# Patient Record
Sex: Female | Born: 1995 | Race: Black or African American | Hispanic: No | Marital: Single | Smoking: Never smoker
Health system: Southern US, Community
[De-identification: ages and names within clinical notes are randomized; demographics above are authoritative.]

## PROBLEM LIST (undated history)

## (undated) DIAGNOSIS — D649 Anemia, unspecified: Secondary | ICD-10-CM

## (undated) DIAGNOSIS — R519 Headache, unspecified: Secondary | ICD-10-CM

## (undated) DIAGNOSIS — F419 Anxiety disorder, unspecified: Secondary | ICD-10-CM

## (undated) DIAGNOSIS — R001 Bradycardia, unspecified: Secondary | ICD-10-CM

## (undated) DIAGNOSIS — J45909 Unspecified asthma, uncomplicated: Secondary | ICD-10-CM

## (undated) HISTORY — DX: Anxiety disorder, unspecified: F41.9

## (undated) HISTORY — DX: Anemia, unspecified: D64.9

## (undated) HISTORY — DX: Headache, unspecified: R51.9

## (undated) HISTORY — DX: Unspecified asthma, uncomplicated: J45.909

---
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
## 2019-10-12 ENCOUNTER — Emergency Department (HOSPITAL_COMMUNITY): Payer: Medicaid Other

## 2019-10-12 ENCOUNTER — Emergency Department (HOSPITAL_COMMUNITY)
Admission: EM | Admit: 2019-10-12 | Discharge: 2019-10-13 | Discharge status: Against Medical Advice | Payer: Medicaid Other | Attending: Emergency Medicine | Admitting: Emergency Medicine

## 2019-10-12 ENCOUNTER — Encounter (HOSPITAL_COMMUNITY): Payer: Self-pay

## 2019-10-12 DIAGNOSIS — Z5321 Procedure and treatment not carried out due to patient leaving prior to being seen by health care provider: Secondary | ICD-10-CM | POA: Diagnosis not present

## 2019-10-12 DIAGNOSIS — R0602 Shortness of breath: Secondary | ICD-10-CM | POA: Diagnosis present

## 2019-10-12 LAB — CBC WITH DIFFERENTIAL/PLATELET
Abs Immature Granulocytes: 0.01 10*3/uL (ref 0.00–0.07)
Basophils Absolute: 0.1 10*3/uL (ref 0.0–0.1)
Basophils Relative: 1 %
Eosinophils Absolute: 0.1 10*3/uL (ref 0.0–0.5)
Eosinophils Relative: 3 %
HCT: 37.7 % (ref 36.0–46.0)
Hemoglobin: 11.8 g/dL — ABNORMAL LOW (ref 12.0–15.0)
Immature Granulocytes: 0 %
Lymphocytes Relative: 33 %
Lymphs Abs: 1.4 10*3/uL (ref 0.7–4.0)
MCH: 25.6 pg — ABNORMAL LOW (ref 26.0–34.0)
MCHC: 31.3 g/dL (ref 30.0–36.0)
MCV: 81.8 fL (ref 80.0–100.0)
Monocytes Absolute: 0.4 10*3/uL (ref 0.1–1.0)
Monocytes Relative: 9 %
Neutro Abs: 2.3 10*3/uL (ref 1.7–7.7)
Neutrophils Relative %: 54 %
Platelets: 323 10*3/uL (ref 150–400)
RBC: 4.61 MIL/uL (ref 3.87–5.11)
RDW: 15.7 % — ABNORMAL HIGH (ref 11.5–15.5)
WBC: 4.3 10*3/uL (ref 4.0–10.5)
nRBC: 0 % (ref 0.0–0.2)

## 2019-10-12 LAB — POC SARS CORONAVIRUS 2 AG -  ED: SARS Coronavirus 2 Ag: NEGATIVE

## 2019-10-12 LAB — BASIC METABOLIC PANEL
Anion gap: 11 (ref 5–15)
BUN: 10 mg/dL (ref 6–20)
CO2: 21 mmol/L — ABNORMAL LOW (ref 22–32)
Calcium: 8.8 mg/dL — ABNORMAL LOW (ref 8.9–10.3)
Chloride: 107 mmol/L (ref 98–111)
Creatinine, Ser: 0.77 mg/dL (ref 0.44–1.00)
GFR calc Af Amer: >60 mL/min (ref >60)
GFR calc non Af Amer: >60 mL/min (ref >60)
Glucose, Bld: 74 mg/dL (ref 70–99)
Potassium: 4.1 mmol/L (ref 3.5–5.1)
Sodium: 139 mmol/L (ref 135–145)

## 2019-10-12 LAB — TROPONIN I (HIGH SENSITIVITY): Troponin I (High Sensitivity): <2 ng/L (ref <18)

## 2019-10-12 NOTE — ED Triage Notes (Signed)
Pt reports that since that morning she has been having SOB and fatigue. Reports being exposed to COVID at work

## 2019-10-13 LAB — TROPONIN I (HIGH SENSITIVITY): Troponin I (High Sensitivity): <2 ng/L (ref <18)

## 2019-10-13 NOTE — ED Notes (Signed)
Called for VS recheck x3, no response. 

## 2020-01-23 ENCOUNTER — Emergency Department (HOSPITAL_COMMUNITY): Payer: Medicaid Other

## 2020-01-23 ENCOUNTER — Encounter (HOSPITAL_COMMUNITY): Payer: Self-pay | Admitting: Emergency Medicine

## 2020-01-23 ENCOUNTER — Other Ambulatory Visit: Payer: Self-pay

## 2020-01-23 ENCOUNTER — Emergency Department (HOSPITAL_COMMUNITY)
Admission: EM | Admit: 2020-01-23 | Discharge: 2020-01-23 | Disposition: A | Discharge status: Home / Self Care | Payer: Medicaid Other | Attending: Emergency Medicine | Admitting: Emergency Medicine

## 2020-01-23 DIAGNOSIS — M542 Cervicalgia: Secondary | ICD-10-CM | POA: Diagnosis not present

## 2020-01-23 DIAGNOSIS — R079 Chest pain, unspecified: Secondary | ICD-10-CM | POA: Diagnosis present

## 2020-01-23 DIAGNOSIS — M436 Torticollis: Secondary | ICD-10-CM | POA: Insufficient documentation

## 2020-01-23 LAB — BASIC METABOLIC PANEL
Anion gap: 10 (ref 5–15)
BUN: 14 mg/dL (ref 6–20)
CO2: 22 mmol/L (ref 22–32)
Calcium: 8.7 mg/dL — ABNORMAL LOW (ref 8.9–10.3)
Chloride: 110 mmol/L (ref 98–111)
Creatinine, Ser: 0.63 mg/dL (ref 0.44–1.00)
GFR calc Af Amer: >60 mL/min (ref >60)
GFR calc non Af Amer: >60 mL/min (ref >60)
Glucose, Bld: 78 mg/dL (ref 70–99)
Potassium: 4.8 mmol/L (ref 3.5–5.1)
Sodium: 142 mmol/L (ref 135–145)

## 2020-01-23 LAB — CBC
HCT: 41.9 % (ref 36.0–46.0)
Hemoglobin: 13 g/dL (ref 12.0–15.0)
MCH: 26.2 pg (ref 26.0–34.0)
MCHC: 31 g/dL (ref 30.0–36.0)
MCV: 84.3 fL (ref 80.0–100.0)
Platelets: 329 10*3/uL (ref 150–400)
RBC: 4.97 MIL/uL (ref 3.87–5.11)
RDW: 15.7 % — ABNORMAL HIGH (ref 11.5–15.5)
WBC: 4.5 10*3/uL (ref 4.0–10.5)
nRBC: 0 % (ref 0.0–0.2)

## 2020-01-23 LAB — I-STAT BETA HCG BLOOD, ED (MC, WL, AP ONLY): I-stat hCG, quantitative: <5 m[IU]/mL (ref <5)

## 2020-01-23 LAB — TROPONIN I (HIGH SENSITIVITY): Troponin I (High Sensitivity): <2 ng/L (ref <18)

## 2020-01-23 MED ORDER — CYCLOBENZAPRINE HCL 10 MG PO TABS: 10.0000 mg | ORAL_TABLET | Freq: Two times a day (BID) | ORAL | 0 refills | Status: AC | PRN

## 2020-01-23 MED ORDER — IBUPROFEN 600 MG PO TABS: 600.0000 mg | ORAL_TABLET | Freq: Four times a day (QID) | ORAL | 0 refills | Status: AC | PRN

## 2020-01-23 MED ORDER — IBUPROFEN 600 MG PO TABS: 600.0000 mg | ORAL_TABLET | Freq: Three times a day (TID) | ORAL | 0 refills | Status: DC | PRN

## 2020-01-23 MED ORDER — IBUPROFEN 200 MG PO TABS
600.0000 mg | ORAL_TABLET | Freq: Once | ORAL | Status: AC
  Administered 2020-01-23: 600 mg via ORAL
  Filled 2020-01-23: qty 3

## 2020-01-23 MED ORDER — PREDNISONE 20 MG PO TABS: 40.0000 mg | ORAL_TABLET | Freq: Every day | ORAL | 0 refills | Status: AC

## 2020-01-23 MED ORDER — PREDNISONE 20 MG PO TABS
40.0000 mg | ORAL_TABLET | Freq: Once | ORAL | Status: AC
  Administered 2020-01-23: 16:00:00 40 mg via ORAL
  Filled 2020-01-23: qty 2

## 2020-01-23 NOTE — ED Triage Notes (Signed)
Per patient, left chest pain since Thursday-has had a stent placed in the past

## 2020-01-23 NOTE — ED Provider Notes (Signed)
Lab tests and xrays reviewed.  No acute findings.  Pt states she is feeling much better after treatment.  No signs of pneumonia, PERC negative, no cardiac injury.  Plan on dc home with ibuprofen , muscle relaxant.   Linwood Dibbles, MD 01/23/20 [phone]

## 2020-01-23 NOTE — ED Provider Notes (Signed)
[place] DEPT Provider Note   CSN: [id number] Arrival date & time: 01/23/20  1412     History Chief Complaint  Patient presents with  . Chest Pain    Pamela Hart is a 24 y.o. female.  HPI   24 year old female with left lateral neck/shoulder/upper chest pain.  Onset Thursday when she woke up.  Worse in the next day.  Pain is persistent.  Hurts at rest and sniffily worse with neck movement and range of motion at the shoulder.  She has not tried taking anything for her symptoms.  No acute respiratory complaints.  No rash.  No fevers or chills.  Denies any radicular symptoms.  History reviewed. No pertinent past medical history.  There are no problems to display for this patient.   History reviewed. No pertinent surgical history.   OB History   No obstetric history on file.     No family history on file.  Social History   Tobacco Use  . Smoking status: Not on file  Substance Use Topics  . Alcohol use: Not on file  . Drug use: Not on file    Home Medications Prior to Admission medications   Medication Sig Start Date End Date Taking? Authorizing Provider  ibuprofen (ADVIL) 600 MG tablet Take 1 tablet (600 mg total) by mouth every 6 (six) hours as needed. 01/23/20   Virgel Manifold, MD  predniSONE (DELTASONE) 20 MG tablet Take 2 tablets (40 mg total) by mouth daily. 01/23/20   Virgel Manifold, MD    Allergies    Bee venom, Penicillins, and Peanut-containing drug products  Review of Systems   Review of Systems All systems reviewed and negative, other than as noted in HPI.  Physical Exam Updated Vital Signs BP 119/65 (BP Location: Left Arm)   Pulse 75   Temp 98 F (36.7 C) (Oral)   Resp 18   LMP 01/20/2020   SpO2 100%   Physical Exam Vitals and nursing note reviewed.  Constitutional:      General: She is not in acute distress.    Appearance: She is well-developed.  HENT:     Head: Normocephalic and atraumatic.    Eyes:     General:        Right eye: No discharge.        Left eye: No discharge.     Conjunctiva/sclera: Conjunctivae normal.  Cardiovascular:     Rate and Rhythm: Normal rate and regular rhythm.     Heart sounds: Normal heart sounds. No murmur. No friction rub. No gallop.   Pulmonary:     Effort: Pulmonary effort is normal. No respiratory distress.     Breath sounds: Normal breath sounds.  Abdominal:     General: There is no distension.     Palpations: Abdomen is soft.     Tenderness: There is no abdominal tenderness.  Musculoskeletal:        General: No tenderness.     Cervical back: Neck supple.     Comments: Tenderness to palpation left lateral neck and left upper trapezius.  Grossly normal in appearance.  Increased pain with abduction of the shoulder.  Neurovascularly intact distally.  No midline tenderness.  Skin:    General: Skin is warm and dry.  Neurological:     Mental Status: She is alert.  Psychiatric:        Behavior: Behavior normal.        Thought Content: Thought content normal.     ED  Results / Procedures / Treatments   Labs (all labs ordered are listed, but only abnormal results are displayed) Labs Reviewed  BASIC METABOLIC PANEL - Abnormal; Notable for the following components:      Result Value   Calcium 8.7 (*)    All other components within normal limits  CBC - Abnormal; Notable for the following components:   RDW 15.7 (*)    All other components within normal limits  I-STAT BETA HCG BLOOD, ED ([facility], [facility], [facility] ONLY)  TROPONIN I (HIGH SENSITIVITY)    EKG EKG Interpretation  Date/Time:  Sunday January 23 2020 14:22:53 EDT Ventricular Rate:  72 PR Interval:    QRS Duration: 77 QT Interval:  379 QTC Calculation: 415 R Axis:   53 Text Interpretation: Sinus rhythm ST elev, probable normal early repol pattern No significant change since last tracing Confirmed by Zacary Bauer ([id number]) on 01/23/2020 3:06:37 PM   Radiology DG Chest 2 View  Result  Date: 01/23/2020 CLINICAL DATA:  Chest pain. EXAM: CHEST - 2 VIEW COMPARISON:  October 12, 2019. FINDINGS: The heart size and mediastinal contours are within normal limits. Both lungs are clear. No pneumothorax or pleural effusion is noted. The visualized skeletal structures are unremarkable. IMPRESSION: No active cardiopulmonary disease. Electronically Signed   By: James  Green Jr M.D.   On: 01/23/2020 15:20    Procedures Procedures (including critical care time)  Medications Ordered in ED Medications  ibuprofen (ADVIL) tablet 600 mg (600 mg Oral Given 01/23/20 1531)  predniSONE (DELTASONE) tablet 40 mg (40 mg Oral Given 01/23/20 1531)    ED Course  I have reviewed the triage vital signs and the nursing notes.  Pertinent labs & imaging results that were available during my care of the patient were reviewed by me and considered in my medical decision making (see chart for details).    MDM Rules/Calculators/A&P                      23 year old female with left lateral neck and shoulder pain.  Very consistent with musculoskeletal etiology given exacerbating features and reproduction with palpation/movement.  Very typical for ACS.  Doubt other emergent processes PE, dissection. Etc. triage note reviewed.  Patient does not have a history of known coronary artery disease nor she had prior stenting.  She has been evaluated previously for chest discomfort and bradycardia.  She did have a Biopatch placed at some point but no significance intervention/testing otherwise.  Imaging without acute abnormality.  EKG is similar to prior.  Care signed out with labs pending.  Anticipate discharge assuming they are noncontributory.  Symptomatic treatment and outpatient follow-up.  Final Clinical Impression(s) / ED Diagnoses Final diagnoses:  Neck pain on left side  Torticollis, acute    Rx / DC Orders ED Discharge Orders         Ordered    predniSONE (DELTASONE) 20 MG tablet  Daily     01/23/20 1524     ibuprofen (ADVIL) 600 MG tablet  Every 6 hours PRN     04 /18/21 1524           11-08-1992, MD 01/26/20 1412

## 2020-03-20 ENCOUNTER — Encounter (HOSPITAL_COMMUNITY): Payer: Self-pay

## 2020-03-20 ENCOUNTER — Other Ambulatory Visit: Payer: Self-pay

## 2020-03-20 ENCOUNTER — Emergency Department (HOSPITAL_COMMUNITY)
Admission: EM | Admit: 2020-03-20 | Discharge: 2020-03-20 | Disposition: A | Discharge status: Home / Self Care | Payer: Medicaid Other | Attending: Emergency Medicine | Admitting: Emergency Medicine

## 2020-03-20 DIAGNOSIS — Z5321 Procedure and treatment not carried out due to patient leaving prior to being seen by health care provider: Secondary | ICD-10-CM | POA: Diagnosis not present

## 2020-03-20 DIAGNOSIS — J029 Acute pharyngitis, unspecified: Secondary | ICD-10-CM | POA: Insufficient documentation

## 2020-03-20 HISTORY — DX: Bradycardia, unspecified: R00.1

## 2020-03-20 LAB — GROUP A STREP BY PCR: Group A Strep by PCR: NOT DETECTED

## 2020-03-20 NOTE — ED Triage Notes (Signed)
Patient c/o sore throat 2 days ago.

## 2020-03-22 ENCOUNTER — Encounter (HOSPITAL_COMMUNITY): Payer: Self-pay

## 2020-03-22 ENCOUNTER — Other Ambulatory Visit: Payer: Self-pay

## 2020-03-22 ENCOUNTER — Emergency Department (HOSPITAL_COMMUNITY)
Admission: EM | Admit: 2020-03-22 | Discharge: 2020-03-22 | Disposition: A | Discharge status: Home / Self Care | Payer: Medicaid Other | Attending: Emergency Medicine | Admitting: Emergency Medicine

## 2020-03-22 DIAGNOSIS — J029 Acute pharyngitis, unspecified: Secondary | ICD-10-CM | POA: Diagnosis not present

## 2020-03-22 LAB — I-STAT BETA HCG BLOOD, ED (MC, WL, AP ONLY): I-stat hCG, quantitative: <5 m[IU]/mL (ref <5)

## 2020-03-22 MED ORDER — CLINDAMYCIN HCL 150 MG PO CAPS
450.0000 mg | ORAL_CAPSULE | Freq: Three times a day (TID) | ORAL | 0 refills | Status: AC
Start: 2020-03-22 — End: 2020-04-01

## 2020-03-22 MED ORDER — LIDOCAINE VISCOUS HCL 2 % MT SOLN
15.0000 mL | Freq: Once | OROMUCOSAL | Status: AC
  Administered 2020-03-22: 15 mL via OROMUCOSAL
  Filled 2020-03-22: qty 15

## 2020-03-22 MED ORDER — CLINDAMYCIN HCL 300 MG PO CAPS
450.0000 mg | ORAL_CAPSULE | Freq: Once | ORAL | Status: AC
  Administered 2020-03-22: 450 mg via ORAL
  Filled 2020-03-22: qty 1

## 2020-03-22 MED ORDER — KETOROLAC TROMETHAMINE 30 MG/ML IJ SOLN
30.0000 mg | Freq: Once | INTRAMUSCULAR | Status: AC
  Administered 2020-03-22: 30 mg via INTRAMUSCULAR
  Filled 2020-03-22: qty 1

## 2020-03-22 MED ORDER — DEXAMETHASONE SODIUM PHOSPHATE 10 MG/ML IJ SOLN
10.0000 mg | Freq: Once | INTRAMUSCULAR | Status: AC
  Administered 2020-03-22: 10 mg via INTRAMUSCULAR
  Filled 2020-03-22: qty 1

## 2020-03-22 MED ORDER — NAPROXEN 500 MG PO TABS: 500.0000 mg | ORAL_TABLET | Freq: Two times a day (BID) | ORAL | 0 refills | Status: DC

## 2020-03-22 NOTE — ED Provider Notes (Signed)
[place] DEPT Provider Note   CSN: [id number] Arrival date & time: 03/22/20  1933     History Chief Complaint  Patient presents with  . Sore Throat    Pamela Hart is a 24 y.o. female with no significant past medical history who presents to the ED due to worsening sore throat since Friday. Sore throat associated with difficulties swallowing, even yogurt and apple sauce. Denies trismus. Denies current changes to phonation, but notes her voice sounds different in the mornings. Last performed oral intercourse earlier this month. Denies difficulties breathing and shortness of breath. Sore throat associated with right-sided neck pain. Admits to having an intermittent fever. T. Max 103. Patient had a strep test on 6/14 which was negative. No treatment prior to arrival.   History obtained from patient and past medical records. No interpreter used during encounter.       Past Medical History:  Diagnosis Date  . Bradycardia     There are no problems to display for this patient.   History reviewed. No pertinent surgical history.   OB History   No obstetric history on file.     Family History  Problem Relation Age of Onset  . Heart murmur Father     Social History   Tobacco Use  . Smoking status: Never Smoker  . Smokeless tobacco: Never Used  Vaping Use  . Vaping Use: Some days  . Substances: Nicotine, Flavoring  Substance Use Topics  . Alcohol use: Never  . Drug use: Never    Home Medications Prior to Admission medications   Medication Sig Start Date End Date Taking? Authorizing Provider  clindamycin (CLEOCIN) 150 MG capsule Take 3 capsules (450 mg total) by mouth 3 (three) times daily for 10 days. 03/22/20 04/01/20  Suzy Bouchard, PA-C  cyclobenzaprine (FLEXERIL) 10 MG tablet Take 1 tablet (10 mg total) by mouth 2 (two) times daily as needed for muscle spasms. 01/23/20   Dorie Rank, MD  ibuprofen (ADVIL) 600 MG tablet Take  1 tablet (600 mg total) by mouth every 6 (six) hours as needed. 01/23/20   Virgel Manifold, MD  ibuprofen (ADVIL) 600 MG tablet Take 1 tablet (600 mg total) by mouth every 8 (eight) hours as needed. 01/23/20   Dorie Rank, MD  naproxen (NAPROSYN) 500 MG tablet Take 1 tablet (500 mg total) by mouth 2 (two) times daily. 03/22/20   Suzy Bouchard, PA-C  predniSONE (DELTASONE) 20 MG tablet Take 2 tablets (40 mg total) by mouth daily. 01/23/20   Virgel Manifold, MD    Allergies    Bee venom, Penicillins, and Peanut-containing drug products  Review of Systems   Review of Systems  Constitutional: Positive for fever. Negative for chills.  HENT: Positive for sore throat, trouble swallowing and voice change (none currently).   Respiratory: Negative for shortness of breath.   Musculoskeletal: Positive for neck pain (right sided neck pain).  Neurological: Negative for headaches.  All other systems reviewed and are negative.   Physical Exam Updated Vital Signs BP 100/71 (BP Location: Right Arm)   Pulse 66   Temp 98.5 F (36.9 C) (Oral)   Resp 18   Ht 5\' 11"  (1.803 m)   Wt 85.3 kg   LMP 03/07/2020 (Approximate)   SpO2 98%   BMI 26.22 kg/m   Physical Exam Vitals and nursing note reviewed.  Constitutional:      General: She is not in acute distress.    Appearance: She is not ill-appearing.  HENT:     Head: Normocephalic.     Mouth/Throat:     Comments: Posterior oropharynx clear and mucous membranes moist, there is mild erythema. 3+ tonsillar hypertrophy on right and 2+ on left. Uvula midline. Exudates present. normal phonation, no trismus, tolerating secretions without difficulty.  Eyes:     Pupils: Pupils are equal, round, and reactive to light.  Neck:     Comments: No meningismus. Cardiovascular:     Rate and Rhythm: Normal rate and regular rhythm.     Pulses: Normal pulses.     Heart sounds: Normal heart sounds. No murmur heard.  No friction rub. No gallop.   Pulmonary:      Effort: Pulmonary effort is normal.     Breath sounds: Normal breath sounds.  Abdominal:     General: Abdomen is flat. There is no distension.     Palpations: Abdomen is soft.     Tenderness: There is no abdominal tenderness. There is no guarding or rebound.  Musculoskeletal:     Cervical back: Neck supple.     Comments: Able to move all 4 extremities without difficulty.  Lymphadenopathy:     Cervical: Cervical adenopathy present.  Skin:    General: Skin is warm and dry.  Neurological:     General: No focal deficit present.     Mental Status: She is alert.  Psychiatric:        Mood and Affect: Mood normal.        Behavior: Behavior normal.     ED Results / Procedures / Treatments   Labs (all labs ordered are listed, but only abnormal results are displayed) Labs Reviewed  I-STAT BETA HCG BLOOD, ED ([facility], [facility], [facility] ONLY)  GC/CHLAMYDIA PROBE AMP ([place]) NOT AT [facility]    EKG None  Radiology No results found.  Procedures Procedures (including critical care time)  Medications Ordered in ED Medications  lidocaine (XYLOCAINE) 2 % viscous mouth solution 15 mL (15 mLs Mouth/Throat Given 03/22/20 2209)  dexamethasone (DECADRON) injection 10 mg (10 mg Intramuscular Given 03/22/20 2209)  clindamycin (CLEOCIN) capsule 450 mg (450 mg Oral Given 03/22/20 2209)  ketorolac (TORADOL) 30 MG/ML injection 30 mg (30 mg Intramuscular Given 03/22/20 2243)    ED Course  I have reviewed the triage vital signs and the nursing notes.  Pertinent labs & imaging results that were available during my care of the patient were reviewed by me and considered in my medical decision making (see chart for details).  Clinical Course as of Mar 22 2242  Wed Mar 22, 2020  2205 I-stat hCG, quantitative: <5.0 [CA]    Clinical Course User Index [CA] Jesusita Oka   MDM Rules/Calculators/A&P                         24 year old female presents to the ED due to worsening sore throat since Friday.   Vitals all within normal limits.  Patient in no acute distress and nontoxic-appearing. Posterior oropharynx clear and mucous membranes moist, there is mild erythema. 3+ tonsillar hypertrophy on right and 2+ on left. Uvula midline. Exudates present. normal phonation, no trismus, tolerating secretions without difficulty. No concern for deep space infection. No meningismus to suggest meningitis.  Positive cervical lymphadenopathy bilaterally.  Strep test on 6/14 negative.  Gonorrhea culture pending.  IM Toradol and Decadron given here in the ED.  Will treat as possible abscess given asymmetrical hypertrophy.  No concern for airway compromise at  this time.  1 dose clindamycin given here in the ED. Will discharge with Clindamycin and naproxen. [facility] number given to patient at discharge. Instructed patient to follow-up with PCP if symptoms do not improve within the next week. Strict ED precautions discussed with patient. Patient states understanding and agrees to plan. Patient discharged home in no acute distress and stable vitals.  Discussed case with Dr. Silverio Lay who agrees with assessment and plan.  Final Clinical Impression(s) / ED Diagnoses Final diagnoses:  Pharyngitis, unspecified etiology    Rx / DC Orders ED Discharge Orders         Ordered    clindamycin (CLEOCIN) 150 MG capsule  3 times daily     Discontinue  Reprint     03/22/20 2238    naproxen (NAPROSYN) 500 MG tablet  2 times daily     Discontinue  Reprint     03/22/20 2238           Mannie Stabile, PA-C 03/22/20 2244    Charlynne Pander, MD 03/22/20 2328

## 2020-03-22 NOTE — Discharge Instructions (Addendum)
As discussed, your strep test was negative 2 days ago.  Your gonorrhea/Chlamydia test are pending.  I am sending home with an antibiotic.  Take 3 times a day for 10 days.  Finish all antibiotics.  I am also sending home with pain medication.  Take as prescribed.  Please follow-up with PCP if symptoms do not improve within the next week.  Return to the ER for new or worsening symptoms.  I have included the number for [facility]. Call tomorrow to schedule an appointment to establish care.

## 2020-03-22 NOTE — ED Triage Notes (Signed)
Arrived POV from home. Patient reports sore throat and swollen tonsils since Friday. Patient seen here in this ED on Monday, had strep test done, but reports she left because she got agitated because of long wait. Patient she is now having difficulty swallowing even yogurt and water. Patient states she feels like she is choking when trying to swallow. Patient speaks full clear sentences during triage maintaining O2 of 100%

## 2020-03-24 LAB — GC/CHLAMYDIA PROBE AMP (~~LOC~~) NOT AT ARMC
Chlamydia: NEGATIVE
Comment: NEGATIVE
Comment: NORMAL
Neisseria Gonorrhea: NEGATIVE

## 2020-04-25 ENCOUNTER — Other Ambulatory Visit: Payer: Self-pay

## 2020-04-25 ENCOUNTER — Emergency Department (HOSPITAL_COMMUNITY)
Admission: EM | Admit: 2020-04-25 | Discharge: 2020-04-25 | Disposition: A | Discharge status: Home / Self Care | Payer: Medicaid Other | Attending: Emergency Medicine | Admitting: Emergency Medicine

## 2020-04-25 ENCOUNTER — Emergency Department (HOSPITAL_COMMUNITY): Payer: Medicaid Other

## 2020-04-25 ENCOUNTER — Encounter (HOSPITAL_COMMUNITY): Payer: Self-pay | Admitting: Emergency Medicine

## 2020-04-25 DIAGNOSIS — G8929 Other chronic pain: Secondary | ICD-10-CM | POA: Diagnosis not present

## 2020-04-25 DIAGNOSIS — R200 Anesthesia of skin: Secondary | ICD-10-CM | POA: Insufficient documentation

## 2020-04-25 DIAGNOSIS — M25512 Pain in left shoulder: Secondary | ICD-10-CM | POA: Insufficient documentation

## 2020-04-25 DIAGNOSIS — R0781 Pleurodynia: Secondary | ICD-10-CM | POA: Insufficient documentation

## 2020-04-25 LAB — PREGNANCY, URINE: Preg Test, Ur: NEGATIVE

## 2020-04-25 MED ORDER — METHOCARBAMOL 500 MG PO TABS
500.0000 mg | ORAL_TABLET | Freq: Two times a day (BID) | ORAL | 0 refills | Status: AC
Start: 2020-04-25 — End: ?

## 2020-04-25 MED ORDER — NAPROXEN 500 MG PO TABS: 500.0000 mg | ORAL_TABLET | Freq: Two times a day (BID) | ORAL | 0 refills | Status: AC

## 2020-04-25 MED ORDER — KETOROLAC TROMETHAMINE 30 MG/ML IJ SOLN
30.0000 mg | Freq: Once | INTRAMUSCULAR | Status: AC
  Administered 2020-04-25: 30 mg via INTRAMUSCULAR
  Filled 2020-04-25: qty 1
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
## 2020-04-25 NOTE — Discharge Instructions (Addendum)
As discussed, your x-rays were negative for any broken bones or acute abnormalities. I am sending you home with a pain medication and muscle relaxer. Take as prescribed and as needed for pain.  Muscle relaxer can cause drowsiness do not drive or operate machinery while on the medication.  I have included the number of the orthopedic surgeon. Call today to schedule an appointment for further evaluation. Continue to use over the counter lidoderm patches and voltaren gel for added pain relief. Return to the ER for new or worsening symptoms.

## 2020-04-25 NOTE — ED Triage Notes (Signed)
Per pt, states left shoulder radiating down arm, pain and numbness-states she has had this in the past but never for 2 days-OTC meds not working

## 2020-09-04 ENCOUNTER — Ambulatory Visit: Payer: Medicaid Other | Admitting: Advanced Practice Midwife

## 2020-09-11 ENCOUNTER — Emergency Department (HOSPITAL_COMMUNITY)
Admission: EM | Admit: 2020-09-11 | Discharge: 2020-09-11 | Disposition: A | Discharge status: Home / Self Care | Payer: Medicaid Other | Attending: Emergency Medicine | Admitting: Emergency Medicine

## 2020-09-11 ENCOUNTER — Other Ambulatory Visit: Payer: Self-pay

## 2020-09-11 ENCOUNTER — Encounter (HOSPITAL_COMMUNITY): Payer: Self-pay | Admitting: Emergency Medicine

## 2020-09-11 DIAGNOSIS — R55 Syncope and collapse: Secondary | ICD-10-CM | POA: Insufficient documentation

## 2020-09-11 DIAGNOSIS — R110 Nausea: Secondary | ICD-10-CM | POA: Insufficient documentation

## 2020-09-11 DIAGNOSIS — M25519 Pain in unspecified shoulder: Secondary | ICD-10-CM | POA: Diagnosis not present

## 2020-09-11 DIAGNOSIS — Z5321 Procedure and treatment not carried out due to patient leaving prior to being seen by health care provider: Secondary | ICD-10-CM | POA: Diagnosis not present

## 2020-09-11 DIAGNOSIS — R079 Chest pain, unspecified: Secondary | ICD-10-CM | POA: Insufficient documentation

## 2020-09-11 LAB — BASIC METABOLIC PANEL
Anion gap: 12 (ref 5–15)
BUN: 10 mg/dL (ref 6–20)
CO2: 21 mmol/L — ABNORMAL LOW (ref 22–32)
Calcium: 9.2 mg/dL (ref 8.9–10.3)
Chloride: 106 mmol/L (ref 98–111)
Creatinine, Ser: 0.73 mg/dL (ref 0.44–1.00)
GFR, Estimated: >60 mL/min (ref >60)
Glucose, Bld: 79 mg/dL (ref 70–99)
Potassium: 3.4 mmol/L — ABNORMAL LOW (ref 3.5–5.1)
Sodium: 139 mmol/L (ref 135–145)

## 2020-09-11 LAB — URINALYSIS, ROUTINE W REFLEX MICROSCOPIC
Bilirubin Urine: NEGATIVE
Glucose, UA: NEGATIVE mg/dL
Hgb urine dipstick: NEGATIVE
Ketones, ur: 5 mg/dL — AB
Leukocytes,Ua: NEGATIVE
Nitrite: NEGATIVE
Protein, ur: NEGATIVE mg/dL
Specific Gravity, Urine: 1.017 (ref 1.005–1.030)
pH: 5 (ref 5.0–8.0)

## 2020-09-11 LAB — CBG MONITORING, ED: Glucose-Capillary: 71 mg/dL (ref 70–99)

## 2020-09-11 LAB — CBC
HCT: 39.9 % (ref 36.0–46.0)
Hemoglobin: 12.7 g/dL (ref 12.0–15.0)
MCH: 26.6 pg (ref 26.0–34.0)
MCHC: 31.8 g/dL (ref 30.0–36.0)
MCV: 83.5 fL (ref 80.0–100.0)
Platelets: 310 10*3/uL (ref 150–400)
RBC: 4.78 MIL/uL (ref 3.87–5.11)
RDW: 14.9 % (ref 11.5–15.5)
WBC: 5 10*3/uL (ref 4.0–10.5)
nRBC: 0 % (ref 0.0–0.2)

## 2020-09-11 LAB — HCG, QUANTITATIVE, PREGNANCY: hCG, Beta Chain, Quant, S: <1 m[IU]/mL (ref <5)
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
## 2020-11-20 ENCOUNTER — Encounter: Payer: Self-pay | Admitting: Advanced Practice Midwife

## 2020-11-20 ENCOUNTER — Other Ambulatory Visit (HOSPITAL_COMMUNITY)
Admission: RE | Admit: 2020-11-20 | Discharge: 2020-11-20 | Disposition: A | Discharge status: Home / Self Care | Payer: Medicaid Other | Source: Ambulatory Visit | Attending: Advanced Practice Midwife | Admitting: Advanced Practice Midwife

## 2020-11-20 ENCOUNTER — Other Ambulatory Visit: Payer: Self-pay

## 2020-11-20 ENCOUNTER — Ambulatory Visit (INDEPENDENT_AMBULATORY_CARE_PROVIDER_SITE_OTHER): Payer: Medicaid Other | Admitting: Advanced Practice Midwife

## 2020-11-20 VITALS — BP 103/60 | HR 75 | Ht 72.0 in | Wt 187.0 lb

## 2020-11-20 DIAGNOSIS — Z113 Encounter for screening for infections with a predominantly sexual mode of transmission: Secondary | ICD-10-CM | POA: Diagnosis not present

## 2020-11-20 DIAGNOSIS — Z3009 Encounter for other general counseling and advice on contraception: Secondary | ICD-10-CM

## 2020-11-20 DIAGNOSIS — Z01812 Encounter for preprocedural laboratory examination: Secondary | ICD-10-CM

## 2020-11-20 DIAGNOSIS — Z30430 Encounter for insertion of intrauterine contraceptive device: Secondary | ICD-10-CM | POA: Diagnosis not present

## 2020-11-20 DIAGNOSIS — Z124 Encounter for screening for malignant neoplasm of cervix: Secondary | ICD-10-CM | POA: Diagnosis present

## 2020-11-20 DIAGNOSIS — Z01419 Encounter for gynecological examination (general) (routine) without abnormal findings: Secondary | ICD-10-CM

## 2020-11-20 DIAGNOSIS — G43829 Menstrual migraine, not intractable, without status migrainosus: Secondary | ICD-10-CM | POA: Insufficient documentation

## 2020-11-20 DIAGNOSIS — Z975 Presence of (intrauterine) contraceptive device: Secondary | ICD-10-CM

## 2020-11-20 DIAGNOSIS — G43109 Migraine with aura, not intractable, without status migrainosus: Secondary | ICD-10-CM

## 2020-11-20 DIAGNOSIS — Z1331 Encounter for screening for depression: Secondary | ICD-10-CM

## 2020-11-20 LAB — POCT URINE PREGNANCY: Preg Test, Ur: NEGATIVE

## 2020-11-20 MED ORDER — LEVONORGESTREL 19.5 MCG/DAY IU IUD: INTRAUTERINE_SYSTEM | Freq: Once | INTRAUTERINE | Status: AC

## 2020-11-20 NOTE — Patient Instructions (Signed)

## 2020-11-20 NOTE — Progress Notes (Signed)
Subjective:     Pamela Hart is a 25 y.o. female here at [facility] for a routine exam.  Current complaints: migraine h/a with menses.  Personal health questionnaire reviewed: yes.  Do you have a primary care provider? yes Do you feel safe at home? yes  Flowsheet Row Office Visit from 11/20/2020 in [facility]  PHQ-2 Total Score 2      Risk factors for chronic health problems: Smoking: vaping Alchohol/how much: limited Pt BMI: Body mass index is 25.36 kg/m.   Gynecologic History Patient's last menstrual period was 11/12/2020. Contraception: condoms Last Pap: unsure. Results were: normal Last mammogram: n/a.   Obstetric History OB History  Gravida Para Term Preterm AB Living  3 2 2   1 2   SAB IAB Ectopic Multiple Live Births          2    # Outcome Date GA Lbr Len/2nd Weight Sex Delivery Anes PTL Lv  3 Term 05/24/17    M Vag-Spont   LIV  2 Term 07/05/15    M Vag-Spont   LIV  1 AB              The following portions of the patient's history were reviewed and updated as appropriate: allergies, current medications, past family history, past medical history, past social history, past surgical history and problem list.  Review of Systems Pertinent items noted in HPI and remainder of comprehensive ROS otherwise negative.    Objective:   BP 103/60   Pulse 75   Ht 6' (1.829 m)   Wt 187 lb (84.8 kg)   LMP 11/12/2020   BMI 25.36 kg/m  VS reviewed, nursing note reviewed,  Constitutional: well developed, well nourished, no distress HEENT: normocephalic CV: normal rate Pulm/chest wall: normal effort Breast Exam: Deferred with low risks and shared decision making, discussed recommendation to start mammogram between 40-50 yo/ exam Abdomen: soft Neuro: alert and oriented x 3 Skin: warm, dry Psych: affect normal Pelvic exam: Performed: Cervix pink, visually closed, without lesion, scant white creamy discharge, vaginal walls and external  genitalia normal Bimanual exam: Cervix 0/long/high, firm, anterior, neg CMT, uterus nontender, nonenlarged, adnexa without tenderness, enlargement, or mass   IUD Procedure Note Patient identified, informed consent performed.  Discussed risks of irregular bleeding, cramping, infection, malpositioning or misplacement of the IUD outside the uterus which may require further procedures. Time out was performed.  Urine pregnancy test negative.  Speculum placed in the vagina.  Cervix visualized.  Cleaned with Betadine x 2.  Grasped anteriorly with a single tooth tenaculum.  Uterus sounded to 7 cm.  Liletta IUD placed per manufacturer's recommendations.  Strings trimmed to 3-4 cm. Tenaculum was removed, good hemostasis noted.  Patient tolerated procedure well.   Patient was given post-procedure instructions and the Liletta care card with expiration date.  Patient was also asked to check IUD strings periodically and follow up in 4-6 weeks for IUD check.  Lot #: [id number] Exp: 01/2023    Assessment/Plan:   1. Screen for STD (sexually transmitted disease) - Cervicovaginal ancillary only( [place]) - RPR+HBsAg+HCVAb+...  2. Cervical cancer screening - Cytology - PAP( [place])  3. Well woman exam with routine gynecological exam --No gyn problems or concerns except h/a, see migraines below.  4. Menstrual migraine without status migrainosus, not intractable --H/A starting behind right eye then moves to whole head, lasts 1-2 days an beginning of menses and often recurs on last day or day after  period, then does not return until next menses.  Different from pt other migraines in that she cannot make them go away with rest in a dark room or eating/drinking fluids.  5. Migraine with aura and without status migrainosus, not intractable  6. Encounter for counseling regarding contraception --Discussed LARCs as most effective forms of birth control.  Discussed benefits/risks of other methods. IUD and  Depo, contraception that reduces and maybe eliminates periods, may improve menstrual migraines. Pt has migraines with aura so estrogen containing contraceptives are contraindicated.  Pt desires IUD today.  Pregnancy test is negative, no unprotected intercourse in 2 weeks.   7. Encounter for IUD insertion --Liletta IUD placed without difficulty. Pt tolerated well. See procedure note above. --May keep Liletta for 7 years with effective prevention of pregnancy, may desire to replace at 5 if menses return. May schedule removal of IUD at any time if pt desires pregnancy.  - POCT urine pregnancy - levonorgestrel (LILETTA) 19.5 MCG/DAY IUD  Follow up in: 4 weeks or as needed.   Sharen Counter, CNM 11:15 AM

## 2020-11-20 NOTE — Progress Notes (Signed)
NEW GYN presents for AEX/PAP/STD Screening.  Reports no problems today  PHQ-9=11

## 2020-11-21 LAB — CERVICOVAGINAL ANCILLARY ONLY
Chlamydia: NEGATIVE
Comment: NEGATIVE
Comment: NEGATIVE
Comment: NORMAL
Neisseria Gonorrhea: NEGATIVE
Trichomonas: POSITIVE — AB

## 2020-11-21 LAB — RPR+HBSAG+HCVAB+...
HIV Screen 4th Generation wRfx: NONREACTIVE
Hep C Virus Ab: <0.1 s/co ratio (ref 0.0–0.9)
Hepatitis B Surface Ag: NEGATIVE
RPR Ser Ql: NONREACTIVE

## 2020-11-22 ENCOUNTER — Other Ambulatory Visit: Payer: Self-pay

## 2020-11-22 DIAGNOSIS — A599 Trichomoniasis, unspecified: Secondary | ICD-10-CM

## 2020-11-22 LAB — CYTOLOGY - PAP
Diagnosis: NEGATIVE
Diagnosis: REACTIVE

## 2020-11-22 MED ORDER — METRONIDAZOLE 500 MG PO TABS: ORAL_TABLET | ORAL | 0 refills | Status: DC

## 2020-11-22 NOTE — Progress Notes (Signed)
Pt sent Mychart message after reviewing +Trich results. Allergies reviewed Flagyl sent to [facility] on [address].

## 2020-12-18 ENCOUNTER — Ambulatory Visit: Payer: Medicaid Other | Admitting: Advanced Practice Midwife

## 2020-12-18 ENCOUNTER — Institutional Professional Consult (permissible substitution): Payer: Medicaid Other | Admitting: Licensed Clinical Social Worker

## 2021-02-07 ENCOUNTER — Ambulatory Visit: Payer: Medicaid Other | Admitting: Obstetrics

## 2021-03-07 ENCOUNTER — Encounter: Payer: Self-pay | Admitting: Obstetrics

## 2021-03-07 ENCOUNTER — Other Ambulatory Visit (HOSPITAL_COMMUNITY)
Admission: RE | Admit: 2021-03-07 | Discharge: 2021-03-07 | Disposition: A | Discharge status: Home / Self Care | Payer: Medicaid Other | Source: Ambulatory Visit | Attending: Obstetrics | Admitting: Obstetrics

## 2021-03-07 ENCOUNTER — Other Ambulatory Visit: Payer: Self-pay

## 2021-03-07 ENCOUNTER — Ambulatory Visit (INDEPENDENT_AMBULATORY_CARE_PROVIDER_SITE_OTHER): Payer: Medicaid Other | Admitting: Obstetrics

## 2021-03-07 VITALS — BP 120/83 | HR 80 | Wt 185.0 lb

## 2021-03-07 DIAGNOSIS — N898 Other specified noninflammatory disorders of vagina: Secondary | ICD-10-CM | POA: Insufficient documentation

## 2021-03-07 DIAGNOSIS — Z30432 Encounter for removal of intrauterine contraceptive device: Secondary | ICD-10-CM

## 2021-03-07 DIAGNOSIS — Z113 Encounter for screening for infections with a predominantly sexual mode of transmission: Secondary | ICD-10-CM | POA: Diagnosis not present

## 2021-03-07 NOTE — Progress Notes (Signed)
Pt presents for Liletta IUD removal. IUD inserted 11/20/2020 Pt believes IUD is contributing to her migraines.  Needs TOC Trich - partner is incarcerated.  Normal pap 11/20/20 Pt requests all STD testing.

## 2021-03-07 NOTE — Progress Notes (Signed)
GYNECOLOGY OFFICE PROCEDURE NOTE  Pamela Hart is a 25 y.o. [id number] here for Liletta IUD removal. No GYN concerns.  Last pap smear was on 11-21-2019 and was normal.  IUD Removal  Patient identified, informed consent performed, consent signed.  Patient was in the dorsal lithotomy position, normal external genitalia was noted.  A speculum was placed in the patient's vagina, normal discharge was noted, no lesions. The cervix was visualized, no lesions, no abnormal discharge.  The strings of the IUD were grasped and pulled using ring forceps. The IUD was removed in its entirety.  Patient tolerated the procedure well.    Patient will use nothing for contraception. ( she has migraines with aura ) Routine preventative health maintenance measures emphasized.   Brock Bad, MD, FACOG Obstetrician & Gynecologist, [facility] for [facility], [facility] 03/07/21   [name], female   DOB: [birth year], 25 y.o.   MRN: [id number]  Chief Complaint  Patient presents with  . Contraception    IUD removal     HPI Pamela Hart is a 25 y.o. female.  Complains of vaginal discharge, new partner and possible STD exposure. HPI  Past Medical History:  Diagnosis Date  . Anemia   . Anxiety   . Asthma   . Bradycardia   . Headache     History reviewed. No pertinent surgical history.  Family History  Problem Relation Age of Onset  . Heart murmur Father   . Hypertension Maternal Grandmother   . Diabetes Maternal Grandfather     Social History Social History   Tobacco Use  . Smoking status: Never Smoker  . Smokeless tobacco: Never Used  Vaping Use  . Vaping Use: Every day  . Substances: Nicotine, Flavoring  Substance Use Topics  . Alcohol use: Yes    Comment: sometimes  . Drug use: Never    Allergies  Allergen Reactions  . Bee Venom Anaphylaxis  . Penicillins Anaphylaxis  . Peanut-Containing Drug  Products Hives    Current Outpatient Medications  Medication Sig Dispense Refill  . cyclobenzaprine (FLEXERIL) 10 MG tablet Take 1 tablet (10 mg total) by mouth 2 (two) times daily as needed for muscle spasms. 14 tablet 0  . ibuprofen (ADVIL) 600 MG tablet Take 1 tablet (600 mg total) by mouth every 6 (six) hours as needed. 30 tablet 0  . ibuprofen (ADVIL) 600 MG tablet Take 1 tablet (600 mg total) by mouth every 8 (eight) hours as needed. 21 tablet 0  . methocarbamol (ROBAXIN) 500 MG tablet Take 1 tablet (500 mg total) by mouth 2 (two) times daily. 20 tablet 0  . metroNIDAZOLE (FLAGYL) 500 MG tablet Take two tablets by mouth twice a day, for one day.  Or you can take all four tablets at once if you can tolerate it. 4 tablet 0  . naproxen (NAPROSYN) 500 MG tablet Take 1 tablet (500 mg total) by mouth 2 (two) times daily. 30 tablet 0  . naproxen (NAPROSYN) 500 MG tablet Take 1 tablet (500 mg total) by mouth 2 (two) times daily. 30 tablet 0  . predniSONE (DELTASONE) 20 MG tablet Take 2 tablets (40 mg total) by mouth daily. 10 tablet 0   No current facility-administered medications for this visit.    Review of Systems Review of Systems Constitutional: negative for fatigue and weight loss Respiratory: negative for cough and wheezing Cardiovascular: negative for chest pain, fatigue and palpitations Gastrointestinal: negative for abdominal pain and change  in bowel habits Genitourinary: positive for vaginal discharge Integument/breast: negative for nipple discharge Musculoskeletal:negative for myalgias Neurological: negative for gait problems and tremors Behavioral/Psych: negative for abusive relationship, depression Endocrine: negative for temperature intolerance      Blood pressure 120/83, pulse 80, weight 185 lb (83.9 kg).  Physical Exam Physical Exam General:   alert and no distress  Skin:   no rash or abnormalities  Lungs:   clear to auscultation bilaterally  Heart:   regular rate  and rhythm, S1, S2 normal, no murmur, click, rub or gallop  Breasts:   not examined  Abdomen:  normal findings: no organomegaly, soft, non-tender and no hernia  Pelvis:  External genitalia: normal general appearance Urinary system: urethral meatus normal and bladder without fullness, nontender Vaginal: normal without tenderness, induration or masses Cervix: normal appearance Adnexa: normal bimanual exam Uterus: anteverted and non-tender, normal size    I have spent a total of 25 minutes of face-to-face time, excluding clinical staff time, reviewing notes and preparing to see patient, ordering tests and/or medications, and counseling the patient.  Data Reviewed Wet Prep and Cultures  Assessment       1. Encounter for IUD removal  2. Vaginal discharge Rx: - Cervicovaginal ancillary only  3. Screening for STD (sexually transmitted disease) Rx: - Hepatitis B surface antigen - Hepatitis C antibody - HIV Antibody (routine testing w rflx) - RPR  Plan   Follow up prn  Orders Placed This Encounter  Procedures  . Hepatitis B surface antigen  . Hepatitis C antibody  . HIV Antibody (routine testing w rflx)  . RPR     Brock Bad, MD 03/07/2021 11:34 AM

## 2021-03-08 ENCOUNTER — Other Ambulatory Visit: Payer: Self-pay | Admitting: Obstetrics

## 2021-03-08 DIAGNOSIS — B379 Candidiasis, unspecified: Secondary | ICD-10-CM

## 2021-03-08 DIAGNOSIS — B9689 Other specified bacterial agents as the cause of diseases classified elsewhere: Secondary | ICD-10-CM

## 2021-03-08 DIAGNOSIS — N760 Acute vaginitis: Secondary | ICD-10-CM

## 2021-03-08 LAB — CERVICOVAGINAL ANCILLARY ONLY
Bacterial Vaginitis (gardnerella): POSITIVE — AB
Candida Glabrata: NEGATIVE
Candida Vaginitis: NEGATIVE
Chlamydia: POSITIVE — AB
Comment: NEGATIVE
Comment: NEGATIVE
Comment: NEGATIVE
Comment: NEGATIVE
Comment: NEGATIVE
Comment: NORMAL
Neisseria Gonorrhea: NEGATIVE
Trichomonas: POSITIVE — AB

## 2021-03-08 LAB — HEPATITIS B SURFACE ANTIGEN: Hepatitis B Surface Ag: NEGATIVE

## 2021-03-08 LAB — HEPATITIS C ANTIBODY: Hep C Virus Ab: <0.1 s/co ratio (ref 0.0–0.9)

## 2021-03-08 LAB — HIV ANTIBODY (ROUTINE TESTING W REFLEX): HIV Screen 4th Generation wRfx: NONREACTIVE

## 2021-03-08 LAB — RPR: RPR Ser Ql: NONREACTIVE

## 2021-03-08 MED ORDER — FLUCONAZOLE 150 MG PO TABS: 150.0000 mg | ORAL_TABLET | Freq: Once | ORAL | 0 refills | Status: AC

## 2021-03-08 MED ORDER — METRONIDAZOLE 500 MG PO TABS: 500.0000 mg | ORAL_TABLET | Freq: Two times a day (BID) | ORAL | 2 refills | Status: DC

## 2021-03-08 NOTE — Addendum Note (Signed)
Addended by: Coral Ceo A on: 03/08/2021 11:44 AM   Modules accepted: Level of Service

## 2021-03-12 ENCOUNTER — Telehealth: Payer: Self-pay

## 2021-03-12 NOTE — Telephone Encounter (Signed)
-----   Message from Brock Bad, MD sent at 03/08/2021  4:31 PM EDT ----- Flagyl Rx for Trichomonas and BV Diflucan Rx for yeast

## 2021-03-13 ENCOUNTER — Telehealth: Payer: Self-pay

## 2021-03-13 NOTE — Telephone Encounter (Signed)
Patient inform of test results.  

## 2021-03-13 NOTE — Telephone Encounter (Signed)
-----   Message from Charles A Harper, MD sent at 03/08/2021  4:31 PM EDT ----- Flagyl Rx for Trichomonas and BV Diflucan Rx for yeast 

## 2021-03-18 ENCOUNTER — Encounter (HOSPITAL_COMMUNITY): Payer: Self-pay | Admitting: Emergency Medicine

## 2021-03-18 ENCOUNTER — Other Ambulatory Visit: Payer: Self-pay

## 2021-03-18 ENCOUNTER — Emergency Department (HOSPITAL_COMMUNITY)
Admission: EM | Admit: 2021-03-18 | Discharge: 2021-03-18 | Disposition: A | Discharge status: Home / Self Care | Payer: Medicaid Other | Attending: Emergency Medicine | Admitting: Emergency Medicine

## 2021-03-18 ENCOUNTER — Emergency Department (HOSPITAL_COMMUNITY): Payer: Medicaid Other

## 2021-03-18 DIAGNOSIS — Z91010 Allergy to peanuts: Secondary | ICD-10-CM | POA: Diagnosis not present

## 2021-03-18 DIAGNOSIS — R112 Nausea with vomiting, unspecified: Secondary | ICD-10-CM | POA: Diagnosis not present

## 2021-03-18 DIAGNOSIS — A599 Trichomoniasis, unspecified: Secondary | ICD-10-CM

## 2021-03-18 DIAGNOSIS — J45909 Unspecified asthma, uncomplicated: Secondary | ICD-10-CM | POA: Insufficient documentation

## 2021-03-18 DIAGNOSIS — R52 Pain, unspecified: Secondary | ICD-10-CM

## 2021-03-18 DIAGNOSIS — R1031 Right lower quadrant pain: Secondary | ICD-10-CM | POA: Diagnosis present

## 2021-03-18 DIAGNOSIS — N83201 Unspecified ovarian cyst, right side: Secondary | ICD-10-CM | POA: Diagnosis not present

## 2021-03-18 LAB — COMPREHENSIVE METABOLIC PANEL
ALT: 9 U/L (ref 0–44)
AST: 13 U/L — ABNORMAL LOW (ref 15–41)
Albumin: 3.8 g/dL (ref 3.5–5.0)
Alkaline Phosphatase: 39 U/L (ref 38–126)
Anion gap: 8 (ref 5–15)
BUN: 16 mg/dL (ref 6–20)
CO2: 23 mmol/L (ref 22–32)
Calcium: 8.7 mg/dL — ABNORMAL LOW (ref 8.9–10.3)
Chloride: 108 mmol/L (ref 98–111)
Creatinine, Ser: 0.58 mg/dL (ref 0.44–1.00)
GFR, Estimated: >60 mL/min (ref >60)
Glucose, Bld: 91 mg/dL (ref 70–99)
Potassium: 3.8 mmol/L (ref 3.5–5.1)
Sodium: 139 mmol/L (ref 135–145)
Total Bilirubin: 0.1 mg/dL — ABNORMAL LOW (ref 0.3–1.2)
Total Protein: 6.7 g/dL (ref 6.5–8.1)

## 2021-03-18 LAB — URINALYSIS, ROUTINE W REFLEX MICROSCOPIC
Bilirubin Urine: NEGATIVE
Glucose, UA: NEGATIVE mg/dL
Hgb urine dipstick: NEGATIVE
Ketones, ur: NEGATIVE mg/dL
Nitrite: NEGATIVE
Protein, ur: NEGATIVE mg/dL
Specific Gravity, Urine: 1.014 (ref 1.005–1.030)
Squamous Epithelial / HPF: >50 — ABNORMAL HIGH (ref 0–5)
pH: 7 (ref 5.0–8.0)

## 2021-03-18 LAB — LIPASE, BLOOD: Lipase: 58 U/L — ABNORMAL HIGH (ref 11–51)

## 2021-03-18 LAB — WET PREP, GENITAL
Sperm: NONE SEEN
Yeast Wet Prep HPF POC: NONE SEEN

## 2021-03-18 LAB — CBC
HCT: 37.5 % (ref 36.0–46.0)
Hemoglobin: 11.8 g/dL — ABNORMAL LOW (ref 12.0–15.0)
MCH: 26.7 pg (ref 26.0–34.0)
MCHC: 31.5 g/dL (ref 30.0–36.0)
MCV: 84.8 fL (ref 80.0–100.0)
Platelets: 254 10*3/uL (ref 150–400)
RBC: 4.42 MIL/uL (ref 3.87–5.11)
RDW: 14.6 % (ref 11.5–15.5)
WBC: 3.8 10*3/uL — ABNORMAL LOW (ref 4.0–10.5)
nRBC: 0 % (ref 0.0–0.2)

## 2021-03-18 LAB — I-STAT BETA HCG BLOOD, ED (MC, WL, AP ONLY): I-stat hCG, quantitative: <5 m[IU]/mL (ref <5)

## 2021-03-18 MED ORDER — METRONIDAZOLE 500 MG PO TABS
500.0000 mg | ORAL_TABLET | Freq: Once | ORAL | Status: AC
  Administered 2021-03-18: 500 mg via ORAL
  Filled 2021-03-18: qty 1

## 2021-03-18 MED ORDER — MORPHINE SULFATE (PF) 4 MG/ML IV SOLN
4.0000 mg | Freq: Once | INTRAVENOUS | Status: AC
  Administered 2021-03-18: 4 mg via INTRAVENOUS
  Filled 2021-03-18: qty 1

## 2021-03-18 MED ORDER — KETOROLAC TROMETHAMINE 15 MG/ML IJ SOLN
15.0000 mg | Freq: Once | INTRAMUSCULAR | Status: AC
  Administered 2021-03-18: 15 mg via INTRAVENOUS
  Filled 2021-03-18: qty 1

## 2021-03-18 MED ORDER — METRONIDAZOLE 500 MG PO TABS: 500.0000 mg | ORAL_TABLET | Freq: Two times a day (BID) | ORAL | 0 refills | Status: DC

## 2021-03-18 MED ORDER — SODIUM CHLORIDE 0.9 % IV BOLUS
1000.0000 mL | Freq: Once | INTRAVENOUS | Status: AC
  Administered 2021-03-18: 1000 mL via INTRAVENOUS

## 2021-03-18 MED ORDER — ONDANSETRON HCL 4 MG/2ML IJ SOLN
4.0000 mg | Freq: Once | INTRAMUSCULAR | Status: AC
  Administered 2021-03-18: 4 mg via INTRAVENOUS
  Filled 2021-03-18: qty 2

## 2021-03-18 NOTE — ED Provider Notes (Signed)
[facility]-EMERGENCY DEPT Provider Note   CSN: [id number] Arrival date & time: 03/18/21  1127     History Chief Complaint  Patient presents with   Abdominal Pain    Pamela Hart is a 25 y.o. female.  The history is provided by the patient and medical records.  Abdominal Pain Pamela Hart is a 25 y.o. female who presents to the Emergency Department complaining of RLQ pain.  She presents to the ED complaining of RLQ pain that started three days ago.  Pain is cramping, worse than labor.  Has associated N/V.  Pain is worse with movement, palpation.    No fever, diarrhea, constipation.  Has referred rectal pain with BM and urination.  No dysuria.  Has malodorous urine, cloudy.  No vaginal discharge.   Has been trying to sleep it off at home without improvement in sxs.  Had an IUD that was removed this month (6/1).      Past Medical History:  Diagnosis Date   Anemia    Anxiety    Asthma    Bradycardia    Headache     Patient Active Problem List   Diagnosis Date Noted   Migraine with aura and without status migrainosus, not intractable 11/20/2020   Menstrual migraine without status migrainosus, not intractable 11/20/2020   IUD (intrauterine device) in place 11/20/2020    History reviewed. No pertinent surgical history.   OB History     Gravida  3   Para  2   Term  2   Preterm      AB  1   Living  2      SAB      IAB      Ectopic      Multiple      Live Births  2           Family History  Problem Relation Age of Onset   Heart murmur Father    Hypertension Maternal Grandmother    Diabetes Maternal Grandfather     Social History   Tobacco Use   Smoking status: Never   Smokeless tobacco: Never  Vaping Use   Vaping Use: Every day   Substances: Nicotine, Flavoring  Substance Use Topics   Alcohol use: Yes    Comment: sometimes   Drug use: Never    Home Medications Prior to Admission medications    Medication Sig Start Date End Date Taking? Authorizing Provider  metroNIDAZOLE (FLAGYL) 500 MG tablet Take 1 tablet (500 mg total) by mouth 2 (two) times daily. 03/18/21  Yes Tilden Fossa, MD  cyclobenzaprine (FLEXERIL) 10 MG tablet Take 1 tablet (10 mg total) by mouth 2 (two) times daily as needed for muscle spasms. 01/23/20   Linwood Dibbles, MD  ibuprofen (ADVIL) 600 MG tablet Take 1 tablet (600 mg total) by mouth every 6 (six) hours as needed. 01/23/20   Raeford Razor, MD  ibuprofen (ADVIL) 600 MG tablet Take 1 tablet (600 mg total) by mouth every 8 (eight) hours as needed. 01/23/20   Linwood Dibbles, MD  methocarbamol (ROBAXIN) 500 MG tablet Take 1 tablet (500 mg total) by mouth 2 (two) times daily. 04/25/20   Mannie Stabile, PA-C  naproxen (NAPROSYN) 500 MG tablet Take 1 tablet (500 mg total) by mouth 2 (two) times daily. 03/22/20   Mannie Stabile, PA-C  naproxen (NAPROSYN) 500 MG tablet Take 1 tablet (500 mg total) by mouth 2 (two) times daily. 04/25/20   Mannie Stabile,  PA-C  predniSONE (DELTASONE) 20 MG tablet Take 2 tablets (40 mg total) by mouth daily. 01/23/20   Raeford Razor, MD    Allergies    Bee venom, Penicillins, and Peanut-containing drug products  Review of Systems   Review of Systems  Gastrointestinal:  Positive for abdominal pain.  All other systems reviewed and are negative.  Physical Exam Updated Vital Signs BP 100/62 (BP Location: Left Arm)   Pulse (!) 51   Temp 97.8 F (36.6 C) (Oral)   Resp 18   LMP 03/07/2021   SpO2 100%   Physical Exam Vitals and nursing note reviewed.  Constitutional:      Appearance: She is well-developed.  HENT:     Head: Normocephalic and atraumatic.  Cardiovascular:     Rate and Rhythm: Normal rate and regular rhythm.     Heart sounds: No murmur heard. Pulmonary:     Effort: Pulmonary effort is normal. No respiratory distress.     Breath sounds: Normal breath sounds.  Abdominal:     Palpations: Abdomen is soft.      Tenderness: There is no guarding or rebound.     Comments: Moderate RLQ tenderness  Genitourinary:    Comments: Scant white vaginal discharge no CMT Musculoskeletal:        General: No tenderness.  Skin:    General: Skin is warm and dry.  Neurological:     Mental Status: She is alert and oriented to person, place, and time.  Psychiatric:        Behavior: Behavior normal.    ED Results / Procedures / Treatments   Labs (all labs ordered are listed, but only abnormal results are displayed) Labs Reviewed  WET PREP, GENITAL - Abnormal; Notable for the following components:      Result Value   Trich, Wet Prep PRESENT (*)    Clue Cells Wet Prep HPF POC PRESENT (*)    WBC, Wet Prep HPF POC MODERATE (*)    All other components within normal limits  LIPASE, BLOOD - Abnormal; Notable for the following components:   Lipase 58 (*)    All other components within normal limits  COMPREHENSIVE METABOLIC PANEL - Abnormal; Notable for the following components:   Calcium 8.7 (*)    AST 13 (*)    Total Bilirubin 0.1 (*)    All other components within normal limits  CBC - Abnormal; Notable for the following components:   WBC 3.8 (*)    Hemoglobin 11.8 (*)    All other components within normal limits  URINALYSIS, ROUTINE W REFLEX MICROSCOPIC - Abnormal; Notable for the following components:   APPearance TURBID (*)    Leukocytes,Ua LARGE (*)    Bacteria, UA FEW (*)    Squamous Epithelial / LPF >50 (*)    All other components within normal limits  I-STAT BETA HCG BLOOD, ED ([facility], [facility], [facility] ONLY)  GC/CHLAMYDIA PROBE AMP ([place]) NOT AT [facility]    EKG None  Radiology CT Abdomen Pelvis Wo Contrast  Result Date: 03/18/2021 CLINICAL DATA:  RIGHT lower quadrant pain EXAM: CT ABDOMEN AND PELVIS WITHOUT CONTRAST TECHNIQUE: Multidetector CT imaging of the abdomen and pelvis was performed following the standard protocol without IV contrast. COMPARISON:  None. FINDINGS: Evaluation is limited secondary  lack of IV contrast. Lower chest: No acute abnormality. Hepatobiliary: Unremarkable noncontrast appearance of the liver. Gallbladder is unremarkable. No extrahepatic biliary ductal dilation. Pancreas: No peripancreatic fat stranding. Spleen: Unremarkable. Adrenals/Urinary Tract: Adrenal glands are unremarkable. No  hydronephrosis. No obstructive nephrolithiasis. Bladder is normal. Stomach/Bowel: Stomach is within normal limits. Appendix appears normal. No evidence of bowel wall thickening, distention, or inflammatory changes. Vascular/Lymphatic: No significant vascular findings are present. No enlarged abdominal or pelvic lymph nodes. Reproductive: Apparent asymmetric enlargement of the RIGHT adnexa with a hypodense mass measuring approximately 3.5 cm, possibly an ovarian cyst. Other: Small volume free fluid in the pelvis. Musculoskeletal: No acute or significant osseous findings. IMPRESSION: 1. There is asymmetric enlargement of the RIGHT adnexa with a likely 3.5 cm ovarian cyst. If concern for ovarian torsion, recommend dedicated evaluation with pelvic ultrasound. 2. Normal appendix. Electronically Signed   By: Meda Klinefelter MD   On: 03/18/2021 17:52   US PELVIC COMPLETE W TRANSVAGINAL AND TORSION R/O  Result Date: 03/18/2021 CLINICAL DATA:  Pelvic pain.  Last menstrual period 03/07/2020. EXAM: TRANSABDOMINAL AND TRANSVAGINAL ULTRASOUND OF PELVIS DOPPLER ULTRASOUND OF OVARIES TECHNIQUE: Both transabdominal and transvaginal ultrasound examinations of the pelvis were performed. Transabdominal technique was performed for global imaging of the pelvis including uterus, ovaries, adnexal regions, and pelvic cul-de-sac. It was necessary to proceed with endovaginal exam following the transabdominal exam to visualize the endometrium and bilateral ovaries. Color and duplex Doppler ultrasound was utilized to evaluate blood flow to the ovaries. COMPARISON:  CT abdomen pelvis 03/18/2021 FINDINGS: Uterus Measurements:  7.9 x 4.3 x 4.9 cm = volume: 86 mL. No fibroids or other mass visualized. Endometrium Thickness: 11 mm.  No focal abnormality visualized. Right ovary Measurements: 4.2 x 4.7 x 3.9 cm = volume: 40 mL. There is a 2.9 x 2.3 x 2.4 cm right ovarian cystic lesion. Trace debris noted within the lesion. No vascularity, septation, or nodularity noted. Left ovary Measurements: 2.8 x 2.7 x 2.5 cm = volume: 10 mL. Normal appearance/no adnexal mass. Pulsed Doppler evaluation of both ovaries demonstrates normal low-resistance arterial and venous waveforms. Other findings Small volume free intraperitoneal fluid. IMPRESSION: 1. A 2.9 cm right ovarian simple versus possibly hemorrhagic cyst. 2. No findings to suggest ovarian torsion; however, this cannot be fully excluded at this can be an intermittent finding. 3. Small volume free intraperitoneal fluid. Electronically Signed   By: Tish Frederickson M.D.   On: 03/18/2021 19:04    Procedures Procedures   Medications Ordered in ED Medications  morphine 4 MG/ML injection 4 mg (4 mg Intravenous Given 03/18/21 1545)  ondansetron (ZOFRAN) injection 4 mg (4 mg Intravenous Given 03/18/21 1543)  sodium chloride 0.9 % bolus 1,000 mL (0 mLs Intravenous Stopped 03/18/21 1921)  ketorolac (TORADOL) 15 MG/ML injection 15 mg (15 mg Intravenous Given 03/18/21 1818)  metroNIDAZOLE (FLAGYL) tablet 500 mg (500 mg Oral Given 03/18/21 2008)    ED Course  I have reviewed the triage vital signs and the nursing notes.  Pertinent labs & imaging results that were available during my care of the patient were reviewed by me and considered in my medical decision making (see chart for details).    MDM Rules/Calculators/A&P                         patient here for evaluation of right lower quadrant pain. She is tender on examination. Initial concern for appendicitis and CT abdomen pelvis was obtained. CT is not consistent with appendicitis but does demonstrate a right ovarian cyst. Pelvic  examination was benign. Exam not consistent with PID or tubular ovarian abscess. Pelvic ultrasound with no evidence of torsion. Discussed with patient home care for ovarian cyst. Also  discussed finding of trichomonas on her wet prep. Discussed home care for trichomonas, cyst in outpatient follow-up and return precautions.   UA not c/w UTI Final Clinical Impression(s) / ED Diagnoses Final diagnoses:  Cyst of right ovary  Trichomonal infection    Rx / DC Orders ED Discharge Orders          Ordered    metroNIDAZOLE (FLAGYL) 500 MG tablet  2 times daily        03/18/21 1946             Tilden Fossaees, Nickolaus Bordelon, MD 03/18/21 2306

## 2021-03-18 NOTE — ED Triage Notes (Signed)
Patient c/o RLQ pain x3 days. Reports intermittent vomiting and decreased appetite. LMP 6/1.

## 2021-03-18 NOTE — Discharge Instructions (Addendum)
You had an ultrasound today that shows a 2.9cm cyst on your right ovary.  You may take tylenol or ibuprofen, available over the counter according to label instructions as needed for pain.

## 2021-03-18 NOTE — ED Provider Notes (Signed)
Emergency Medicine Provider Triage Evaluation Note  Pamela Hart , a 25 y.o. female  was evaluated in triage.  Pt complains of right-sided abdominal pain.  Started on Wednesday of last week, was intermittent.  On Thursday she had multiple episodes of vomiting and took a pregnancy test which Resulted negative.  On Friday after eating.  She had right-sided abdominal pain that has since been constant, worse with movement, worse with food.  She has had 2 previous births, but no other abdominal surgeries.  She was diagnosed with an STD a few weeks ago, but has not undergone treatment.  Review of Systems  Positive: Right-sided abdominal pain.,  Vomiting, nausea Negative: Fevers, chills  Physical Exam  BP (!) 133/97 (BP Location: Right Arm)   Pulse 78   Temp 98.7 F (37.1 C) (Oral)   Resp 16   LMP 03/07/2021   SpO2 100%  Gen:   Awake, no distress. Tearful Resp:  Normal effort  MSK:   Moves extremities without difficulty  Other:  Right lower quadrant extremely tender to palpation.  Right upper quadrant is tender, right CVA tenderness noted.  Medical Decision Making  Medically screening exam initiated at 12:13 PM.  Appropriate orders placed.  Tya-Shawntae Dysert was informed that the remainder of the evaluation will be completed by another provider, this initial triage assessment does not replace that evaluation, and the importance of remaining in the ED until their evaluation is complete.     Theron Arista, PA-C 03/18/21 1215    Vanetta Mulders, MD 03/21/21 1630

## 2021-03-18 NOTE — ED Notes (Signed)
Need to get Beta when pt comes to room.

## 2021-03-19 LAB — GC/CHLAMYDIA PROBE AMP (~~LOC~~) NOT AT ARMC
Chlamydia: POSITIVE — AB
Comment: NEGATIVE
Comment: NORMAL
Neisseria Gonorrhea: NEGATIVE

## 2021-03-30 ENCOUNTER — Encounter: Payer: Self-pay | Admitting: *Deleted

## 2021-04-16 ENCOUNTER — Ambulatory Visit: Payer: Medicaid Other | Admitting: Obstetrics

## 2021-08-02 ENCOUNTER — Ambulatory Visit: Payer: Medicaid Other

## 2022-02-25 ENCOUNTER — Emergency Department (HOSPITAL_COMMUNITY)
Admission: EM | Admit: 2022-02-25 | Discharge: 2022-02-26 | Disposition: A | Discharge status: Home / Self Care | Payer: Medicaid Other | Attending: Emergency Medicine | Admitting: Emergency Medicine

## 2022-02-25 ENCOUNTER — Encounter (HOSPITAL_COMMUNITY): Payer: Self-pay

## 2022-02-25 ENCOUNTER — Other Ambulatory Visit: Payer: Self-pay

## 2022-02-25 DIAGNOSIS — Z7952 Long term (current) use of systemic steroids: Secondary | ICD-10-CM | POA: Insufficient documentation

## 2022-02-25 DIAGNOSIS — J45909 Unspecified asthma, uncomplicated: Secondary | ICD-10-CM | POA: Diagnosis not present

## 2022-02-25 DIAGNOSIS — R109 Unspecified abdominal pain: Secondary | ICD-10-CM

## 2022-02-25 DIAGNOSIS — R1031 Right lower quadrant pain: Secondary | ICD-10-CM | POA: Insufficient documentation

## 2022-02-25 DIAGNOSIS — Z91010 Allergy to peanuts: Secondary | ICD-10-CM | POA: Diagnosis not present

## 2022-02-25 NOTE — ED Triage Notes (Signed)
Pt complains of right flank pain since yesterday. Pt states that the pain caused her to pass out.

## 2022-02-26 LAB — COMPREHENSIVE METABOLIC PANEL
ALT: 8 U/L (ref 0–44)
AST: 13 U/L — ABNORMAL LOW (ref 15–41)
Albumin: 3.9 g/dL (ref 3.5–5.0)
Alkaline Phosphatase: 39 U/L (ref 38–126)
Anion gap: 7 (ref 5–15)
BUN: 14 mg/dL (ref 6–20)
CO2: 20 mmol/L — ABNORMAL LOW (ref 22–32)
Calcium: 8.8 mg/dL — ABNORMAL LOW (ref 8.9–10.3)
Chloride: 110 mmol/L (ref 98–111)
Creatinine, Ser: 0.68 mg/dL (ref 0.44–1.00)
GFR, Estimated: >60 mL/min (ref >60)
Glucose, Bld: 103 mg/dL — ABNORMAL HIGH (ref 70–99)
Potassium: 3.9 mmol/L (ref 3.5–5.1)
Sodium: 137 mmol/L (ref 135–145)
Total Bilirubin: 0.4 mg/dL (ref 0.3–1.2)
Total Protein: 7.1 g/dL (ref 6.5–8.1)

## 2022-02-26 LAB — CBC WITH DIFFERENTIAL/PLATELET
Abs Immature Granulocytes: 0.06 10*3/uL (ref 0.00–0.07)
Basophils Absolute: 0 10*3/uL (ref 0.0–0.1)
Basophils Relative: 1 %
Eosinophils Absolute: 0.1 10*3/uL (ref 0.0–0.5)
Eosinophils Relative: 2 %
HCT: 39.2 % (ref 36.0–46.0)
Hemoglobin: 12.8 g/dL (ref 12.0–15.0)
Immature Granulocytes: 1 %
Lymphocytes Relative: 43 %
Lymphs Abs: 2.4 10*3/uL (ref 0.7–4.0)
MCH: 26.8 pg (ref 26.0–34.0)
MCHC: 32.7 g/dL (ref 30.0–36.0)
MCV: 82 fL (ref 80.0–100.0)
Monocytes Absolute: 0.5 10*3/uL (ref 0.1–1.0)
Monocytes Relative: 10 %
Neutro Abs: 2.4 10*3/uL (ref 1.7–7.7)
Neutrophils Relative %: 43 %
Platelets: 298 10*3/uL (ref 150–400)
RBC: 4.78 MIL/uL (ref 3.87–5.11)
RDW: 14.4 % (ref 11.5–15.5)
WBC: 5.5 10*3/uL (ref 4.0–10.5)
nRBC: 0 % (ref 0.0–0.2)

## 2022-02-26 LAB — URINALYSIS, ROUTINE W REFLEX MICROSCOPIC
Bilirubin Urine: NEGATIVE
Glucose, UA: NEGATIVE mg/dL
Hgb urine dipstick: NEGATIVE
Ketones, ur: 5 mg/dL — AB
Leukocytes,Ua: NEGATIVE
Nitrite: NEGATIVE
Protein, ur: NEGATIVE mg/dL
Specific Gravity, Urine: 1.033 — ABNORMAL HIGH (ref 1.005–1.030)
pH: 5 (ref 5.0–8.0)

## 2022-02-26 LAB — I-STAT BETA HCG BLOOD, ED (MC, WL, AP ONLY): I-stat hCG, quantitative: <5 m[IU]/mL (ref <5)

## 2022-02-26 LAB — LIPASE, BLOOD: Lipase: 51 U/L (ref 11–51)

## 2022-02-26 MED ORDER — KETOROLAC TROMETHAMINE 15 MG/ML IJ SOLN
30.0000 mg | Freq: Once | INTRAMUSCULAR | Status: AC
  Administered 2022-02-26: 30 mg via INTRAMUSCULAR
  Filled 2022-02-26: qty 2

## 2022-02-26 MED ORDER — KETOROLAC TROMETHAMINE 15 MG/ML IJ SOLN: 15.0000 mg | Freq: Once | INTRAMUSCULAR | Status: DC

## 2022-02-26 NOTE — ED Provider Notes (Signed)
[facility]-EMERGENCY DEPT Provider Note  CSN: [id number] Arrival date & time: 02/25/22 2341  Chief Complaint(s) Flank Pain  HPI Pamela Hart is a 26 y.o. female     Flank Pain This is a new problem. The current episode started yesterday. Episode frequency: intermittently. Progression since onset: fluctuating. Pertinent negatives include no chest pain, no abdominal pain, no headaches and no shortness of breath. Nothing aggravates the symptoms. Nothing relieves the symptoms. She has tried nothing for the symptoms.   LMP 4/21. Has not started her cycle yet. Denies any vaginal bleeding or discharge. Reports that she is sexually active but was less active 1 month ago.  Past Medical History Past Medical History:  Diagnosis Date   Anemia    Anxiety    Asthma    Bradycardia    Headache    Patient Active Problem List   Diagnosis Date Noted   Migraine with aura and without status migrainosus, not intractable 11/20/2020   Menstrual migraine without status migrainosus, not intractable 11/20/2020   IUD (intrauterine device) in place 11/20/2020   Home Medication(s) Prior to Admission medications   Medication Sig Start Date End Date Taking? Authorizing Provider  cyclobenzaprine (FLEXERIL) 10 MG tablet Take 1 tablet (10 mg total) by mouth 2 (two) times daily as needed for muscle spasms. 01/23/20   Linwood DibblesKnapp, Jon, MD  ibuprofen (ADVIL) 600 MG tablet Take 1 tablet (600 mg total) by mouth every 6 (six) hours as needed. 01/23/20   Raeford RazorKohut, Stephen, MD  ibuprofen (ADVIL) 600 MG tablet Take 1 tablet (600 mg total) by mouth every 8 (eight) hours as needed. 01/23/20   Linwood DibblesKnapp, Jon, MD  methocarbamol (ROBAXIN) 500 MG tablet Take 1 tablet (500 mg total) by mouth 2 (two) times daily. 04/25/20   Mannie StabileAberman, Caroline C, PA-C  metroNIDAZOLE (FLAGYL) 500 MG tablet Take 1 tablet (500 mg total) by mouth 2 (two) times daily. 03/18/21   Tilden Fossaees, Elizabeth, MD  naproxen (NAPROSYN) 500 MG tablet Take  1 tablet (500 mg total) by mouth 2 (two) times daily. 03/22/20   Mannie StabileAberman, Caroline C, PA-C  naproxen (NAPROSYN) 500 MG tablet Take 1 tablet (500 mg total) by mouth 2 (two) times daily. 04/25/20   Mannie StabileAberman, Caroline C, PA-C  predniSONE (DELTASONE) 20 MG tablet Take 2 tablets (40 mg total) by mouth daily. 01/23/20   Raeford RazorKohut, Stephen, MD                                                                                                                                    Allergies Bee venom, Penicillins, and Peanut-containing drug products  Review of Systems Review of Systems  Respiratory:  Negative for shortness of breath.   Cardiovascular:  Negative for chest pain.  Gastrointestinal:  Negative for abdominal pain.  Genitourinary:  Positive for flank pain.  Neurological:  Negative for headaches.  As noted in HPI  Physical Exam Vital Signs  I have  reviewed the triage vital signs BP (!) 97/54   Pulse 60   Temp 98.4 F (36.9 C) (Oral)   Resp 16   Ht 5\' 11"  (1.803 m)   Wt 81.6 kg   SpO2 100%   BMI 25.10 kg/m   Physical Exam Vitals reviewed.  Constitutional:      General: She is not in acute distress.    Appearance: She is well-developed. She is not diaphoretic.  HENT:     Head: Normocephalic and atraumatic.     Right Ear: External ear normal.     Left Ear: External ear normal.     Nose: Nose normal.  Eyes:     General: No scleral icterus.    Conjunctiva/sclera: Conjunctivae normal.  Neck:     Trachea: Phonation normal.  Cardiovascular:     Rate and Rhythm: Normal rate and regular rhythm.  Pulmonary:     Effort: Pulmonary effort is normal. No respiratory distress.     Breath sounds: No stridor.  Abdominal:     General: There is no distension.     Tenderness: There is abdominal tenderness in the right lower quadrant. There is right CVA tenderness.  Musculoskeletal:        General: Normal range of motion.     Cervical back: Normal range of motion.  Neurological:     Mental Status:  She is alert and oriented to person, place, and time.  Psychiatric:        Behavior: Behavior normal.    ED Results and Treatments Labs (all labs ordered are listed, but only abnormal results are displayed) Labs Reviewed  COMPREHENSIVE METABOLIC PANEL - Abnormal; Notable for the following components:      Result Value   CO2 20 (*)    Glucose, Bld 103 (*)    Calcium 8.8 (*)    AST 13 (*)    All other components within normal limits  URINALYSIS, ROUTINE W REFLEX MICROSCOPIC - Abnormal; Notable for the following components:   Specific Gravity, Urine 1.033 (*)    Ketones, ur 5 (*)    All other components within normal limits  LIPASE, BLOOD  CBC WITH DIFFERENTIAL/PLATELET  I-STAT BETA HCG BLOOD, ED ([facility], [facility], [facility] ONLY)                                                                                                                         EKG  EKG Interpretation  Date/Time:    Ventricular Rate:    PR Interval:    QRS Duration:   QT Interval:    QTC Calculation:   R Axis:     Text Interpretation:         Radiology No results found.  Pertinent labs & imaging results that were available during my care of the patient were reviewed by me and considered in my medical decision making (see MDM for details).  Medications Ordered in ED Medications  ketorolac (TORADOL) 15 MG/ML injection 30 mg (30 mg  Intramuscular Given 02/26/22 6759)                                                                                                                                     Procedures Procedures  (including critical care time)  Medical Decision Making / ED Course    Complexity of Problem:  Patient's presenting problem/concern, DDX, and MDM listed below: Lower abdominal pain We will assess for serious intra-abdominal inflammatory/infectious process such as appendicitis We will assess for pregnancy related process, urinary infections. Doubt torsion Possible menstrual  pains  Hospitalization Considered:  Yes     Complexity of Data:   Laboratory Tests ordered listed below with my independent interpretation: CBC without leukocytosis or anemia No significant electrolyte derangements sufficiency No evidence of biliary obstruction or pancreatitis UA without evidence of infection hCG negative   Imaging Studies ordered listed below with my independent interpretation: To be unnecessary at this time given the reassuring labs     ED Course:    Assessment, Add'l Intervention, and Reassessment: Lower abdominal pain Work-up has been reassuring Given the reassuring labs, I have low suspicion for serious intra-abdominal inflammatory/infectious process requiring imaging at this time. Pain improved with Toradol    Final Clinical Impression(s) / ED Diagnoses Final diagnoses:  Abdominal cramping   The patient appears reasonably screened and/or stabilized for discharge and I doubt any other medical condition or other [facility] requiring further screening, evaluation, or treatment in the ED at this time prior to discharge. Safe for discharge with strict return precautions.  Disposition: Discharge  Condition: Good  I have discussed the results, Dx and Tx plan with the patient/family who expressed understanding and agree(s) with the plan. Discharge instructions discussed at length. The patient/family was given strict return precautions who verbalized understanding of the instructions. No further questions at time of discharge.    ED Discharge Orders     None        Follow Up: Primary care provider  Call  to schedule an appointment for close follow up           This chart was dictated using voice recognition software.  Despite best efforts to proofread,  errors can occur which can change the documentation meaning.    Nira Conn, MD 02/26/22 [phone]

## 2022-04-11 IMAGING — US US PELVIS COMPLETE TRANSABD/TRANSVAG W DUPLEX
1 series · 15 of 25 positions shown · non-contrast
Comparison: CT abdomen pelvis 03/18/2021

CLINICAL DATA: Pelvic pain.  Last menstrual period 03/07/2020.

EXAM:
TRANSABDOMINAL AND TRANSVAGINAL ULTRASOUND OF PELVIS
DOPPLER ULTRASOUND OF OVARIES
TECHNIQUE: Both transabdominal and transvaginal ultrasound examinations of the
pelvis were performed. Transabdominal technique was performed for
global imaging of the pelvis including uterus, ovaries, adnexal
regions, and pelvic cul-de-sac.
It was necessary to proceed with endovaginal exam following the
transabdominal exam to visualize the endometrium and bilateral
ovaries. Color and duplex Doppler ultrasound was utilized to
evaluate blood flow to the ovaries.

[Series 1: us art/ven flow abd pelv doppl mc & wl · 15 of 75 slices shown]
[im 1/75]
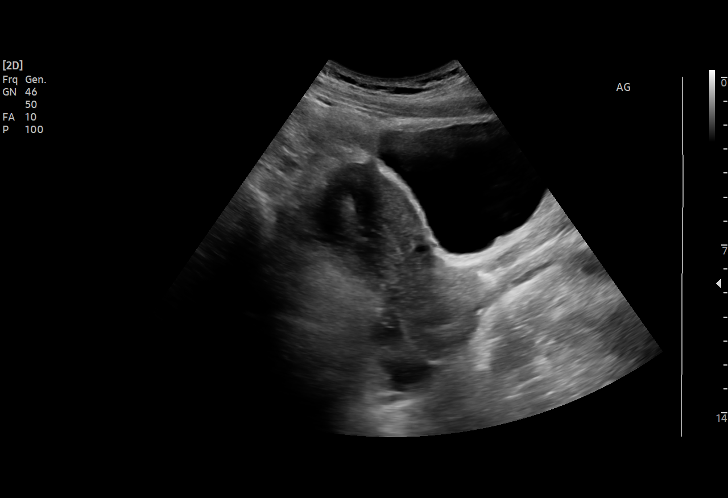
[im 7/75]
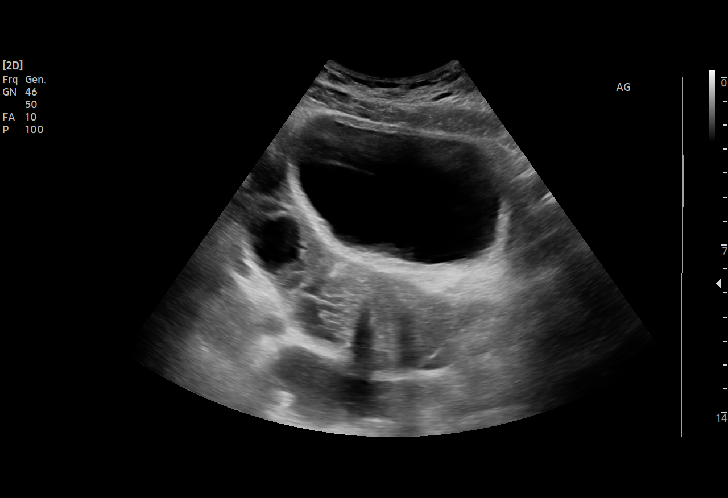
[im 13/75]
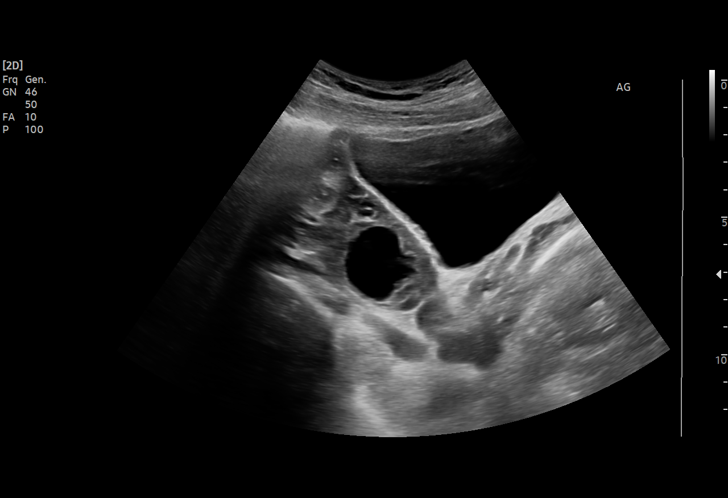
[im 16/75]
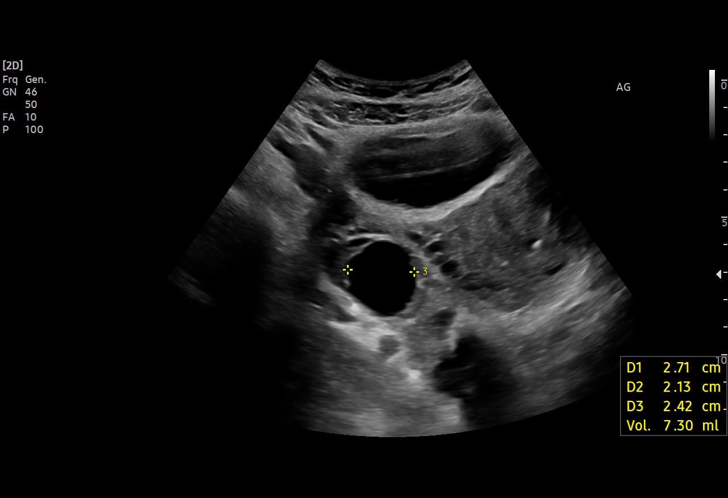
[im 22/75]
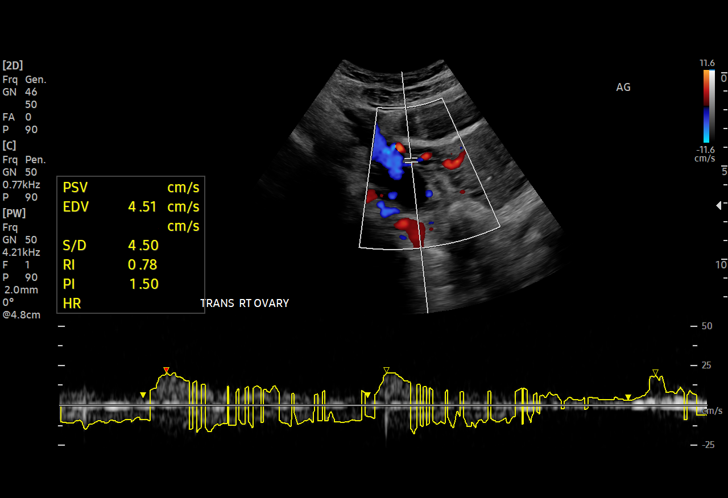
[im 28/75]
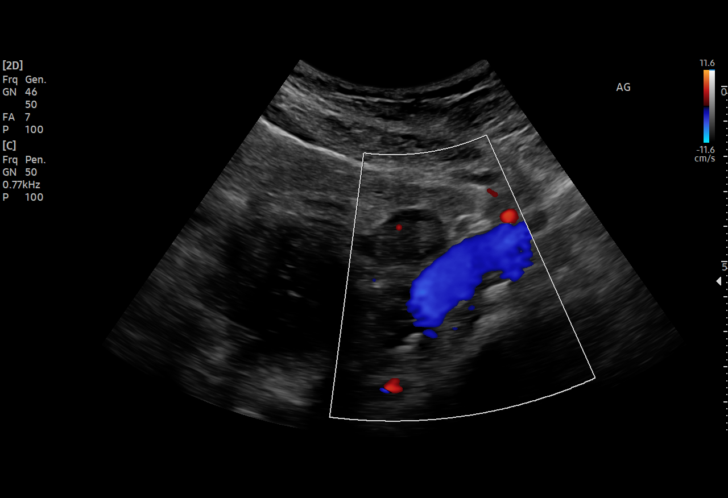
[im 31/75]
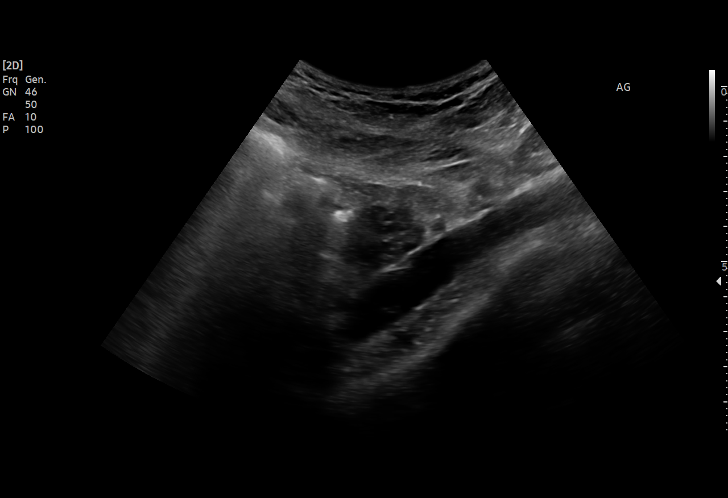
[im 38/75]
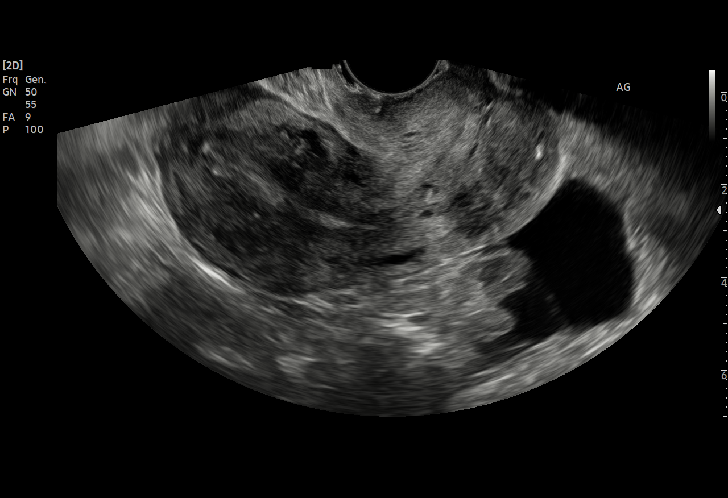
[im 44/75]
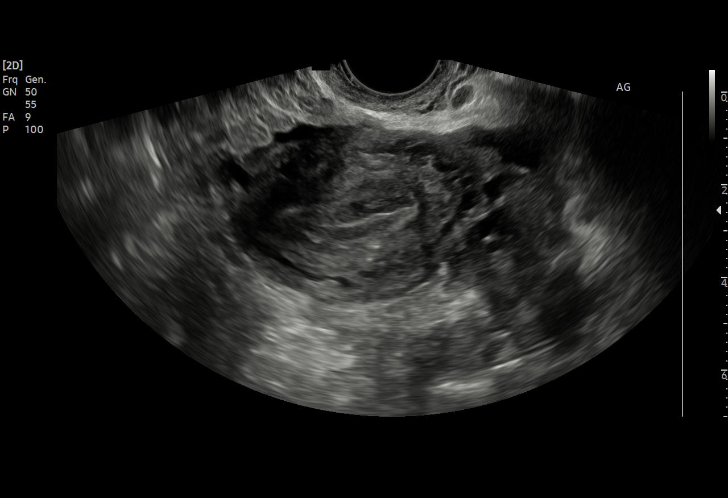
[im 47/75]
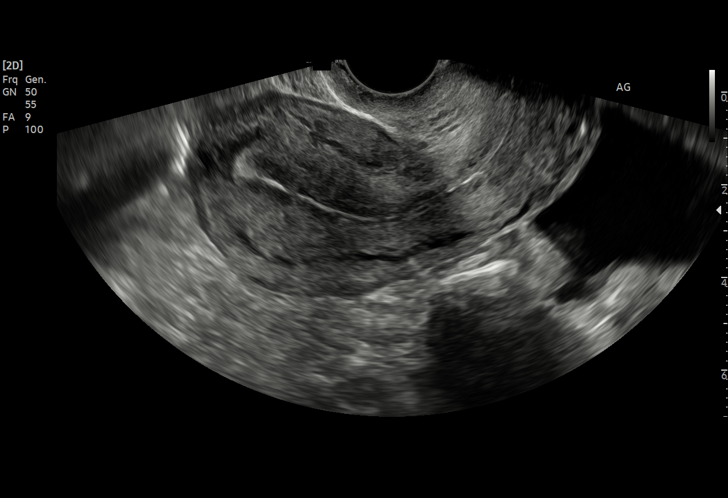
[im 53/75]
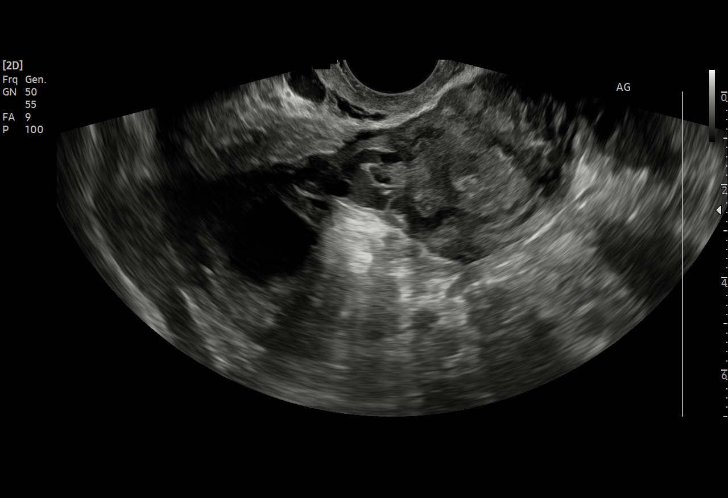
[im 59/75]
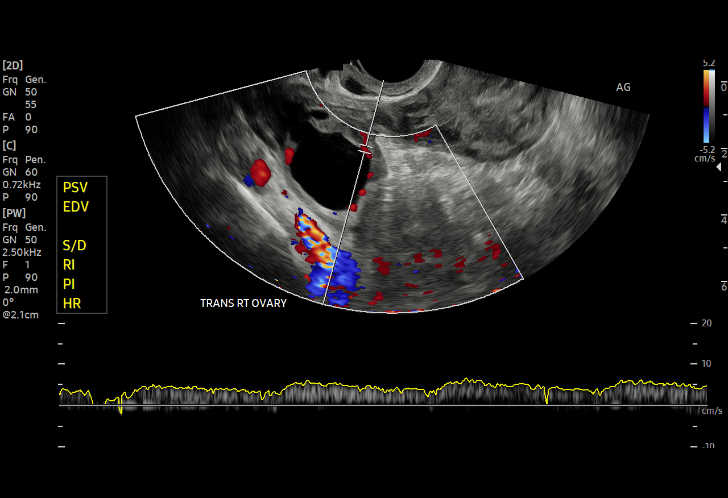
[im 62/75]
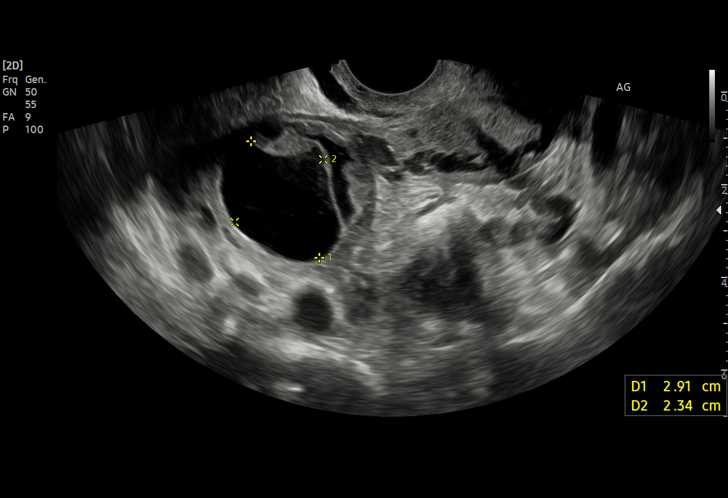
[im 68/75]
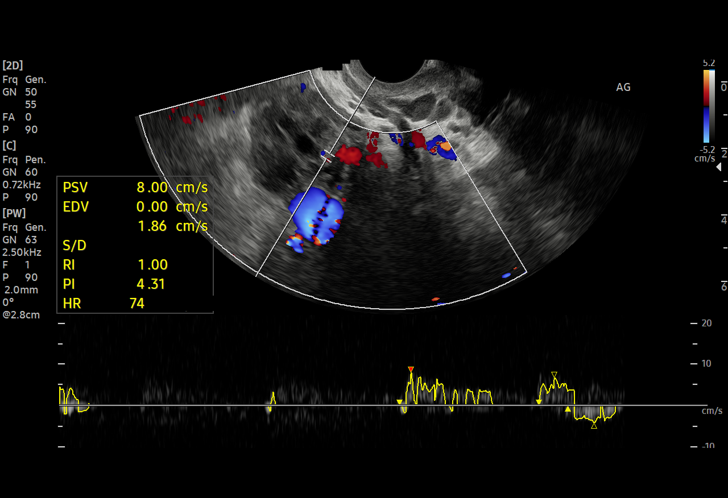
[im 75/75]
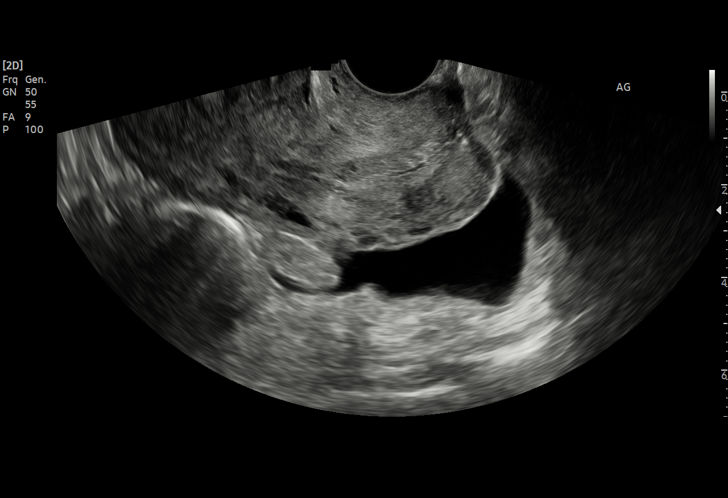

[15 of 25 positions shown; findings below may reference images not displayed]

FINDINGS: Uterus

Measurements: 7.9 x 4.3 x 4.9 cm = volume: 86 mL. No fibroids or
other mass visualized.

Endometrium

Thickness: 11 mm.  No focal abnormality visualized.

Right ovary

Measurements: 4.2 x 4.7 x 3.9 cm = volume: 40 mL. There is a 2.9 x
2.3 x 2.4 cm right ovarian cystic lesion. Trace debris noted within
the lesion. No vascularity, septation, or nodularity noted.

Left ovary

Measurements: 2.8 x 2.7 x 2.5 cm = volume: 10 mL. Normal
appearance/no adnexal mass.

Pulsed Doppler evaluation of both ovaries demonstrates normal
low-resistance arterial and venous waveforms.

Other findings

Small volume free intraperitoneal fluid.
IMPRESSION: 1. A 2.9 cm right ovarian simple versus possibly hemorrhagic cyst.
2. No findings to suggest ovarian torsion; however, this cannot be
fully excluded at this can be an intermittent finding.
3. Small volume free intraperitoneal fluid.

## 2022-04-11 IMAGING — CT CT ABD-PELV W/O CM
2 of 4 series · 16 of 46 positions shown, 18 images · non-contrast
Comparison: None.

CLINICAL DATA: RIGHT lower quadrant pain

EXAM:
CT ABDOMEN AND PELVIS WITHOUT CONTRAST
TECHNIQUE: Multidetector CT imaging of the abdomen and pelvis was performed
following the standard protocol without IV contrast.

[Series 2: axial st · axial · 0.83mm/px · z∈[-482,-42]mm · 13 of 100 slices shown, 15 images]
[im 6/100  soft-tissue]
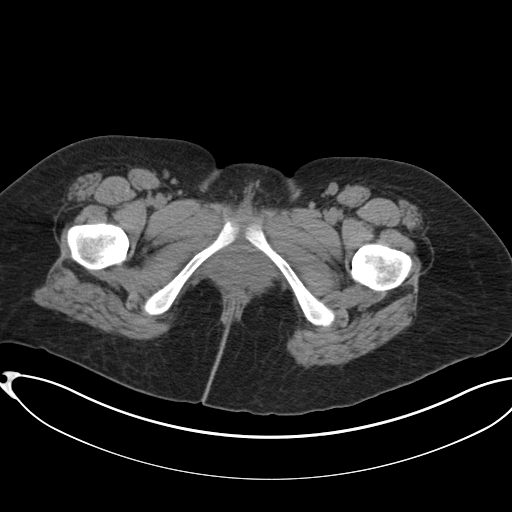
[im 6/100  bone]
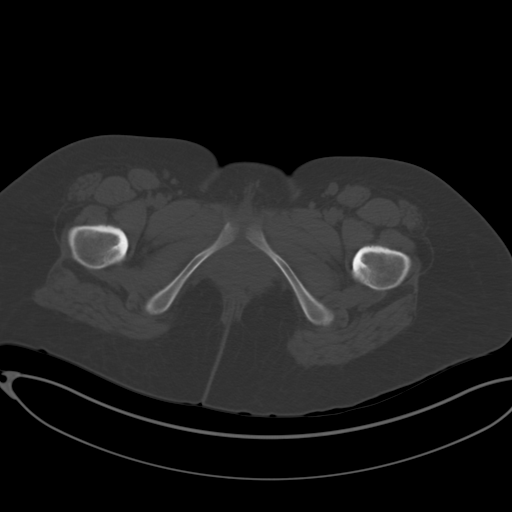
[im 16/100  soft-tissue]
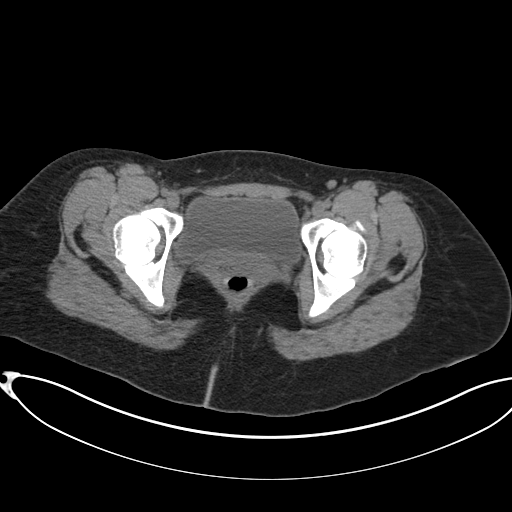
[im 21/100  soft-tissue]
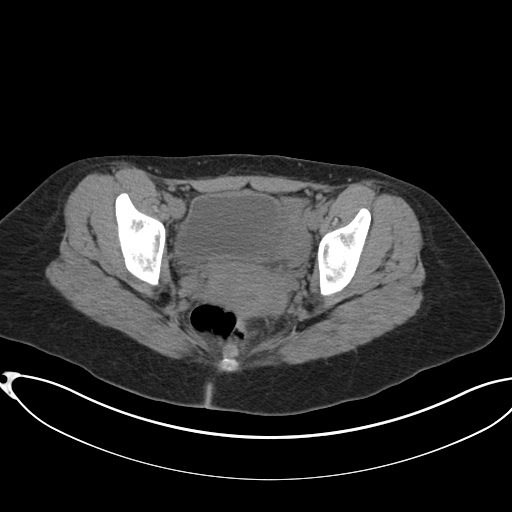
[im 27/100  soft-tissue]
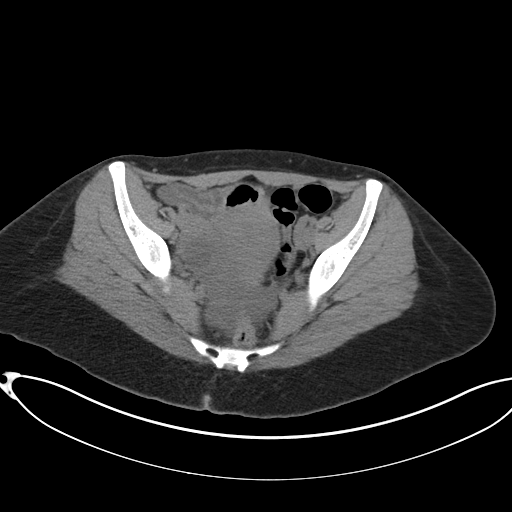
[im 37/100  soft-tissue]
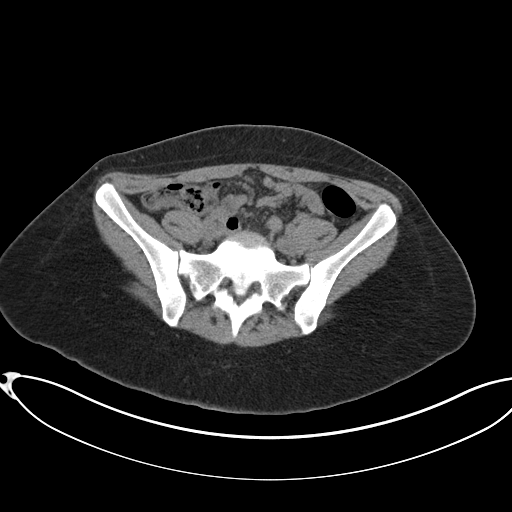
[im 42/100  soft-tissue]
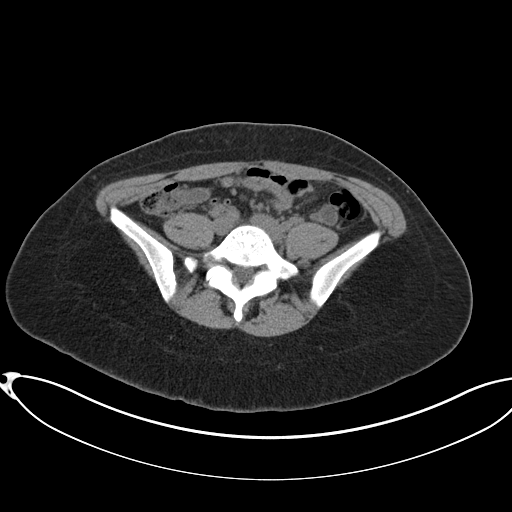
[im 53/100  soft-tissue]
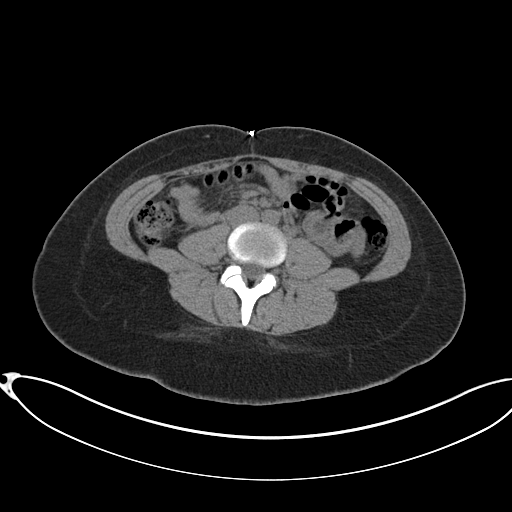
[im 58/100  soft-tissue]
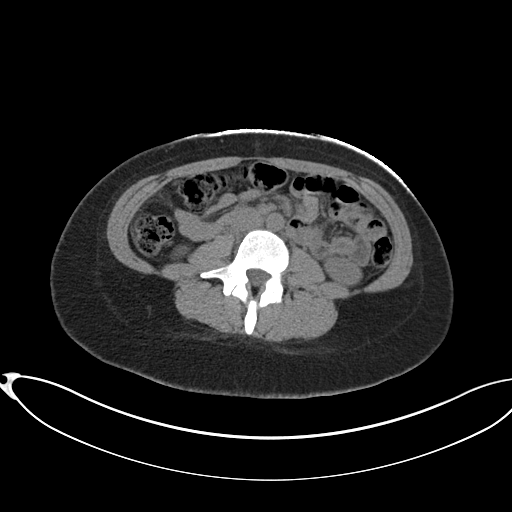
[im 63/100  soft-tissue]
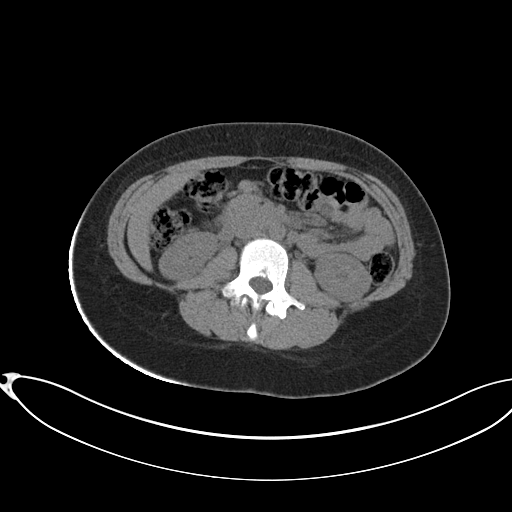
[im 63/100  bone]
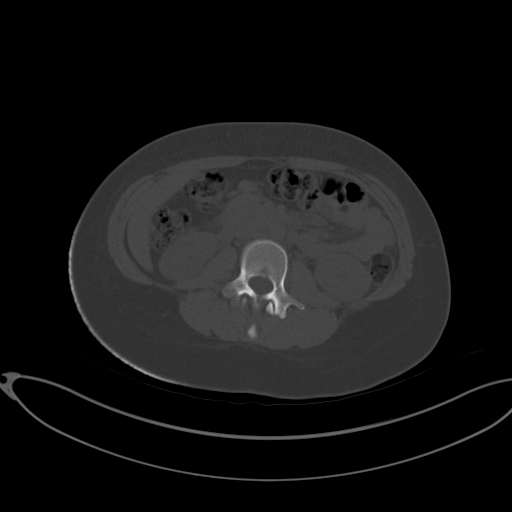
[im 73/100  soft-tissue]
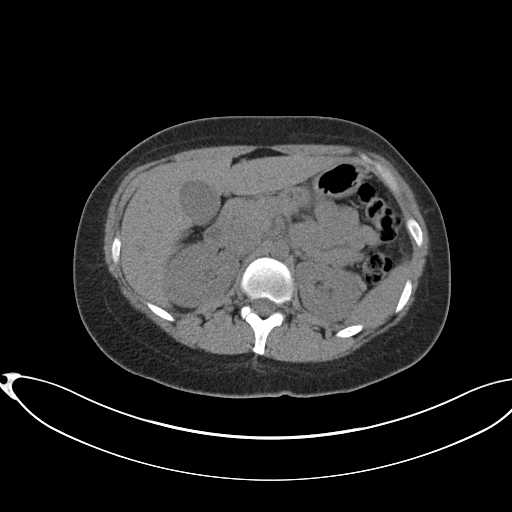
[im 79/100  soft-tissue]
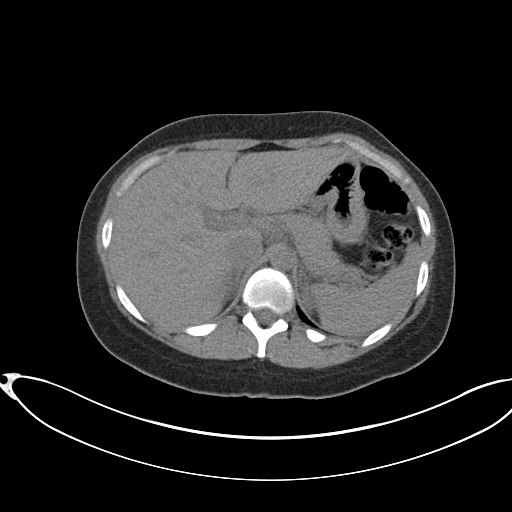
[im 84/100  soft-tissue]
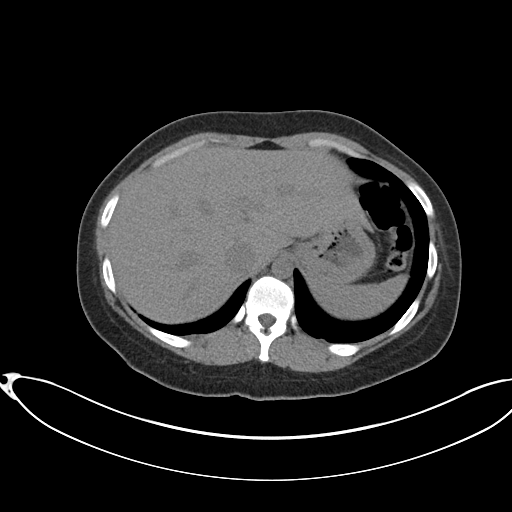
[im 94/100  soft-tissue]
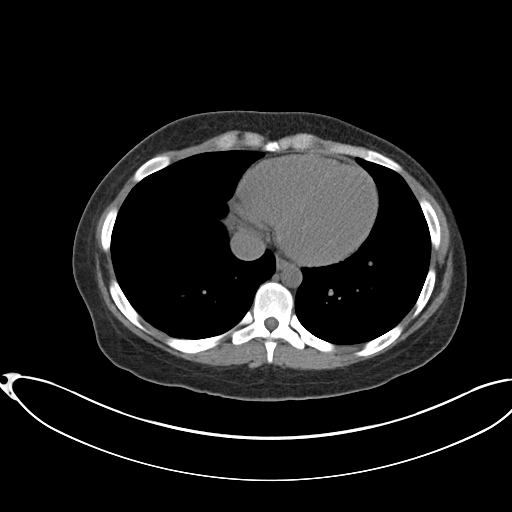

[Series 5: coronal st · coronal · 0.78mm/px · 3 of 140 slices shown]
[im 47/140  soft-tissue]
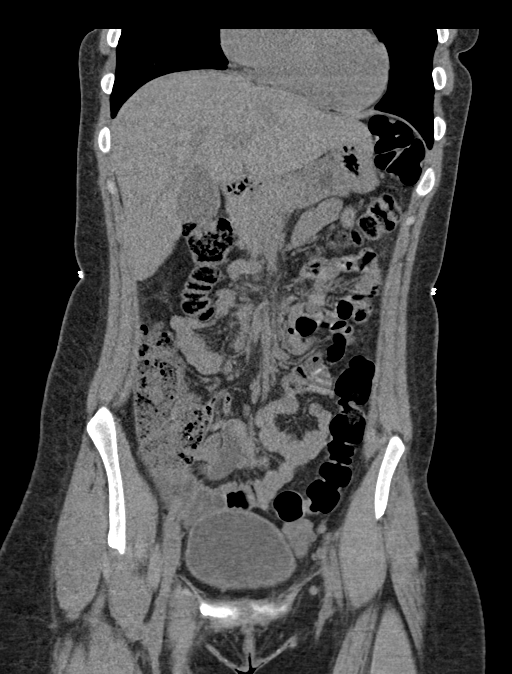
[im 62/140  soft-tissue]
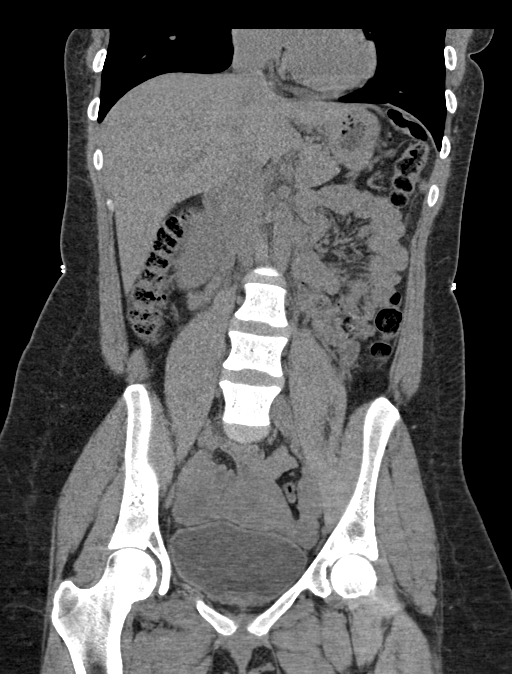
[im 78/140  soft-tissue]
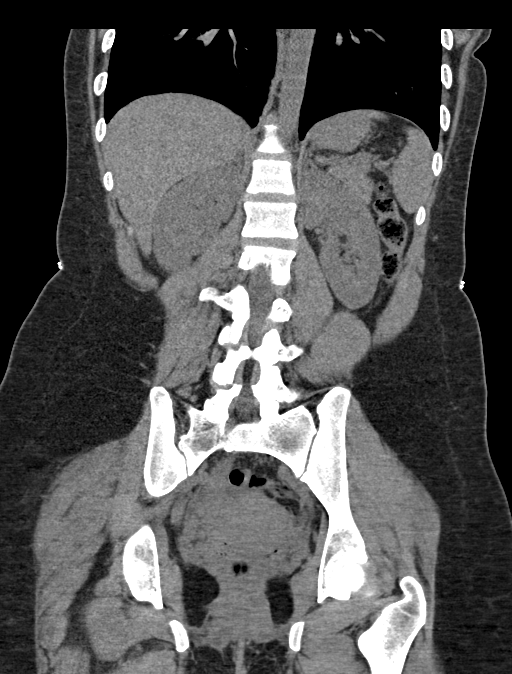

[16 of 46 positions shown; findings below may reference images not displayed]

FINDINGS: Evaluation is limited secondary lack of IV contrast.

Lower chest: No acute abnormality.

Hepatobiliary: Unremarkable noncontrast appearance of the liver.
Gallbladder is unremarkable. No extrahepatic biliary ductal
dilation.

Pancreas: No peripancreatic fat stranding.

Spleen: Unremarkable.

Adrenals/Urinary Tract: Adrenal glands are unremarkable. No
hydronephrosis. No obstructive nephrolithiasis. Bladder is normal.

Stomach/Bowel: Stomach is within normal limits. Appendix appears
normal. No evidence of bowel wall thickening, distention, or
inflammatory changes.

Vascular/Lymphatic: No significant vascular findings are present. No
enlarged abdominal or pelvic lymph nodes.

Reproductive: Apparent asymmetric enlargement of the RIGHT adnexa
with a hypodense mass measuring approximately 3.5 cm, possibly an
ovarian cyst.

Other: Small volume free fluid in the pelvis.

Musculoskeletal: No acute or significant osseous findings.
IMPRESSION: 1. There is asymmetric enlargement of the RIGHT adnexa with a likely
3.5 cm ovarian cyst. If concern for ovarian torsion, recommend
dedicated evaluation with pelvic ultrasound.
2. Normal appendix.

## 2022-05-01 ENCOUNTER — Other Ambulatory Visit: Payer: Self-pay

## 2022-05-01 ENCOUNTER — Emergency Department (HOSPITAL_COMMUNITY)
Admission: EM | Admit: 2022-05-01 | Discharge: 2022-05-02 | Disposition: A | Discharge status: Home / Self Care | Payer: Medicaid Other | Attending: Emergency Medicine | Admitting: Emergency Medicine

## 2022-05-01 ENCOUNTER — Encounter (HOSPITAL_COMMUNITY): Payer: Self-pay

## 2022-05-01 DIAGNOSIS — R109 Unspecified abdominal pain: Secondary | ICD-10-CM | POA: Diagnosis present

## 2022-05-01 DIAGNOSIS — Z91010 Allergy to peanuts: Secondary | ICD-10-CM | POA: Insufficient documentation

## 2022-05-01 DIAGNOSIS — N9489 Other specified conditions associated with female genital organs and menstrual cycle: Secondary | ICD-10-CM | POA: Insufficient documentation

## 2022-05-01 DIAGNOSIS — R1031 Right lower quadrant pain: Secondary | ICD-10-CM

## 2022-05-01 DIAGNOSIS — B9689 Other specified bacterial agents as the cause of diseases classified elsewhere: Secondary | ICD-10-CM | POA: Insufficient documentation

## 2022-05-01 DIAGNOSIS — N760 Acute vaginitis: Secondary | ICD-10-CM | POA: Insufficient documentation

## 2022-05-01 NOTE — ED Triage Notes (Signed)
Right sided abd cramping, frequency and pressure with urination x2 days. Heating pad with no relief.  Hx of ovarian cyst

## 2022-05-02 LAB — WET PREP, GENITAL
Sperm: NONE SEEN
Trich, Wet Prep: NONE SEEN
WBC, Wet Prep HPF POC: <10 (ref <10)
Yeast Wet Prep HPF POC: NONE SEEN

## 2022-05-02 LAB — GC/CHLAMYDIA PROBE AMP (~~LOC~~) NOT AT ARMC
Chlamydia: NEGATIVE
Comment: NEGATIVE
Comment: NORMAL
Neisseria Gonorrhea: NEGATIVE

## 2022-05-02 LAB — COMPREHENSIVE METABOLIC PANEL
ALT: 10 U/L (ref 0–44)
AST: 15 U/L (ref 15–41)
Albumin: 3.8 g/dL (ref 3.5–5.0)
Alkaline Phosphatase: 38 U/L (ref 38–126)
Anion gap: 9 (ref 5–15)
BUN: 7 mg/dL (ref 6–20)
CO2: 23 mmol/L (ref 22–32)
Calcium: 8.8 mg/dL — ABNORMAL LOW (ref 8.9–10.3)
Chloride: 109 mmol/L (ref 98–111)
Creatinine, Ser: 0.67 mg/dL (ref 0.44–1.00)
GFR, Estimated: >60 mL/min (ref >60)
Glucose, Bld: 89 mg/dL (ref 70–99)
Potassium: 3.8 mmol/L (ref 3.5–5.1)
Sodium: 141 mmol/L (ref 135–145)
Total Bilirubin: 0.2 mg/dL — ABNORMAL LOW (ref 0.3–1.2)
Total Protein: 6.9 g/dL (ref 6.5–8.1)

## 2022-05-02 LAB — URINALYSIS, ROUTINE W REFLEX MICROSCOPIC
Bilirubin Urine: NEGATIVE
Glucose, UA: NEGATIVE mg/dL
Hgb urine dipstick: NEGATIVE
Ketones, ur: NEGATIVE mg/dL
Leukocytes,Ua: NEGATIVE
Nitrite: NEGATIVE
Protein, ur: NEGATIVE mg/dL
Specific Gravity, Urine: 1.023 (ref 1.005–1.030)
pH: 5 (ref 5.0–8.0)

## 2022-05-02 LAB — CBC
HCT: 39.2 % (ref 36.0–46.0)
Hemoglobin: 12.3 g/dL (ref 12.0–15.0)
MCH: 26.6 pg (ref 26.0–34.0)
MCHC: 31.4 g/dL (ref 30.0–36.0)
MCV: 84.7 fL (ref 80.0–100.0)
Platelets: 318 10*3/uL (ref 150–400)
RBC: 4.63 MIL/uL (ref 3.87–5.11)
RDW: 14.2 % (ref 11.5–15.5)
WBC: 3.3 10*3/uL — ABNORMAL LOW (ref 4.0–10.5)
nRBC: 0 % (ref 0.0–0.2)

## 2022-05-02 LAB — LIPASE, BLOOD: Lipase: 49 U/L (ref 11–51)

## 2022-05-02 LAB — I-STAT BETA HCG BLOOD, ED (MC, WL, AP ONLY): I-stat hCG, quantitative: <5 m[IU]/mL (ref <5)

## 2022-05-02 LAB — HIV ANTIBODY (ROUTINE TESTING W REFLEX): HIV Screen 4th Generation wRfx: NONREACTIVE

## 2022-05-02 LAB — RPR: RPR Ser Ql: NONREACTIVE

## 2022-05-02 MED ORDER — METRONIDAZOLE 500 MG PO TABS: 500.0000 mg | ORAL_TABLET | Freq: Two times a day (BID) | ORAL | 0 refills | Status: AC

## 2022-05-02 NOTE — ED Provider Notes (Signed)
[facility]-EMERGENCY DEPT Provider Note   CSN: [id number] Arrival date & time: 05/01/22  2333     History  Chief Complaint  Patient presents with   Abdominal Pain    Pamela Hart is a 26 y.o. female.  The history is provided by the patient and medical records.  Abdominal Pain Pamela Kreiser is a 26 y.o. female who presents to the Emergency Department complaining of abdominal pain.  She presents to the emergency department for evaluation of 2 days of right-sided abdominal cramping that improves when she urinates.  She does have urinary frequency.  No fevers.  She has had a little bit of vomiting.  No diarrhea, no vaginal discharge.  No medications.  Her cycle ended 1 day ago.  She does have a new sexual partner, does not use protection.  She reports similar symptoms in the past when experiencing an ovarian cyst.  She does not take any OTC analgesics.     Home Medications Prior to Admission medications   Medication Sig Start Date End Date Taking? Authorizing Provider  ibuprofen (ADVIL) 600 MG tablet Take 1 tablet (600 mg total) by mouth every 6 (six) hours as needed. Patient taking differently: Take 600 mg by mouth every 6 (six) hours as needed for fever or mild pain. 01/23/20  Yes Raeford Razor, MD  metroNIDAZOLE (FLAGYL) 500 MG tablet Take 1 tablet (500 mg total) by mouth 2 (two) times daily. 05/02/22  Yes Tilden Fossa, MD  cyclobenzaprine (FLEXERIL) 10 MG tablet Take 1 tablet (10 mg total) by mouth 2 (two) times daily as needed for muscle spasms. Patient not taking: Reported on 05/02/2022 01/23/20   Linwood Dibbles, MD  methocarbamol (ROBAXIN) 500 MG tablet Take 1 tablet (500 mg total) by mouth 2 (two) times daily. Patient not taking: Reported on 05/02/2022 04/25/20   Mannie Stabile, PA-C  naproxen (NAPROSYN) 500 MG tablet Take 1 tablet (500 mg total) by mouth 2 (two) times daily. Patient not taking: Reported on 05/02/2022 04/25/20   Mannie Stabile, PA-C  predniSONE (DELTASONE) 20 MG tablet Take 2 tablets (40 mg total) by mouth daily. Patient not taking: Reported on 05/02/2022 01/23/20   Raeford Razor, MD      Allergies    Bee venom, Peanut oil, Penicillins, and Peanut-containing drug products    Review of Systems   Review of Systems  Gastrointestinal:  Positive for abdominal pain.  All other systems reviewed and are negative.   Physical Exam Updated Vital Signs BP (!) 100/56   Pulse (!) 57   Temp 98 F (36.7 C)   Resp 20   Ht 5\' 11"  (1.803 m)   Wt 81 kg   SpO2 100%   BMI 24.91 kg/m  Physical Exam Vitals and nursing note reviewed.  Constitutional:      Appearance: She is well-developed.  HENT:     Head: Normocephalic and atraumatic.  Cardiovascular:     Rate and Rhythm: Normal rate and regular rhythm.  Pulmonary:     Effort: Pulmonary effort is normal. No respiratory distress.  Abdominal:     Palpations: Abdomen is soft.     Tenderness: There is no guarding or rebound.     Comments: Mild right mid abdominal tenderness  Genitourinary:    Comments: Small amount of old blood in the vaginal vault.  No CMT or adnexal tenderness Musculoskeletal:        General: No tenderness.  Skin:    General: Skin is warm and dry.  Neurological:     Mental Status: She is alert and oriented to person, place, and time.  Psychiatric:        Behavior: Behavior normal.     ED Results / Procedures / Treatments   Labs (all labs ordered are listed, but only abnormal results are displayed) Labs Reviewed  WET PREP, GENITAL - Abnormal; Notable for the following components:      Result Value   Clue Cells Wet Prep HPF POC PRESENT (*)    All other components within normal limits  COMPREHENSIVE METABOLIC PANEL - Abnormal; Notable for the following components:   Calcium 8.8 (*)    Total Bilirubin 0.2 (*)    All other components within normal limits  CBC - Abnormal; Notable for the following components:   WBC 3.3 (*)    All other  components within normal limits  URINALYSIS, ROUTINE W REFLEX MICROSCOPIC - Abnormal; Notable for the following components:   APPearance HAZY (*)    Bacteria, UA RARE (*)    All other components within normal limits  LIPASE, BLOOD  RPR  HIV ANTIBODY (ROUTINE TESTING W REFLEX)  I-STAT BETA HCG BLOOD, ED ([facility], [facility], [facility] ONLY)  GC/CHLAMYDIA PROBE AMP ([place]) NOT AT [facility]    EKG None  Radiology No results found.  Procedures Procedures    Medications Ordered in ED Medications - No data to display  ED Course/ Medical Decision Making/ A&P                           Medical Decision Making Amount and/or Complexity of Data Reviewed Labs: ordered.  Risk Prescription drug management.   Patient here for evaluation of right-sided abdominal pain, feels like her prior ovarian cyst.  She does have mild tenderness on examination without peritoneal findings.  Pelvic examination is benign.  Wet prep is positive for clue cells, will treat.  Current clinical picture is not consistent with acute appendicitis, tubo-ovarian abscess, torsion, renal colic.  Discussed with patient home care for abdominal pain, BV.  Discussed possible ovarian cyst.  Discussed return precautions if she has progressive or concerning symptoms that she will need reevaluated for possible appendicitis or other process if there is worsening in her symptoms.  On record review patient has had chlamydia in the past-she states that she was notified of this and treated appropriately.        Final Clinical Impression(s) / ED Diagnoses Final diagnoses:  Right lower quadrant abdominal pain  BV (bacterial vaginosis)    Rx / DC Orders ED Discharge Orders          Ordered    metroNIDAZOLE (FLAGYL) 500 MG tablet  2 times daily        05/02/22 0439              Tilden Fossa, MD 05/02/22 [phone]

## 2022-05-29 NOTE — ED Notes (Signed)
Pt called about lab results. Printed for nurse to call.

## 2023-03-26 ENCOUNTER — Emergency Department (HOSPITAL_COMMUNITY): Payer: Medicaid Other

## 2023-03-26 ENCOUNTER — Emergency Department (HOSPITAL_COMMUNITY)
Admission: EM | Admit: 2023-03-26 | Discharge: 2023-03-26 | Disposition: A | Discharge status: Home / Self Care | Payer: Medicaid Other | Attending: Emergency Medicine | Admitting: Emergency Medicine

## 2023-03-26 ENCOUNTER — Encounter (HOSPITAL_COMMUNITY): Payer: Self-pay

## 2023-03-26 DIAGNOSIS — Z91010 Allergy to peanuts: Secondary | ICD-10-CM | POA: Diagnosis not present

## 2023-03-26 DIAGNOSIS — N3000 Acute cystitis without hematuria: Secondary | ICD-10-CM | POA: Diagnosis not present

## 2023-03-26 DIAGNOSIS — R079 Chest pain, unspecified: Secondary | ICD-10-CM | POA: Insufficient documentation

## 2023-03-26 LAB — BASIC METABOLIC PANEL
Anion gap: 7 (ref 5–15)
BUN: 10 mg/dL (ref 6–20)
CO2: 22 mmol/L (ref 22–32)
Calcium: 8.5 mg/dL — ABNORMAL LOW (ref 8.9–10.3)
Chloride: 106 mmol/L (ref 98–111)
Creatinine, Ser: 0.7 mg/dL (ref 0.44–1.00)
GFR, Estimated: >60 mL/min (ref >60)
Glucose, Bld: 88 mg/dL (ref 70–99)
Potassium: 3.5 mmol/L (ref 3.5–5.1)
Sodium: 135 mmol/L (ref 135–145)

## 2023-03-26 LAB — URINALYSIS, W/ REFLEX TO CULTURE (INFECTION SUSPECTED)
Bilirubin Urine: NEGATIVE
Glucose, UA: NEGATIVE mg/dL
Hgb urine dipstick: NEGATIVE
Ketones, ur: NEGATIVE mg/dL
Nitrite: NEGATIVE
Protein, ur: NEGATIVE mg/dL
Specific Gravity, Urine: 1.015 (ref 1.005–1.030)
pH: 7 (ref 5.0–8.0)

## 2023-03-26 LAB — D-DIMER, QUANTITATIVE: D-Dimer, Quant: 0.63 ug/mL-FEU — ABNORMAL HIGH (ref 0.00–0.50)

## 2023-03-26 LAB — I-STAT BETA HCG BLOOD, ED (MC, WL, AP ONLY): I-stat hCG, quantitative: <5 m[IU]/mL (ref <5)

## 2023-03-26 LAB — CBC
HCT: 39.2 % (ref 36.0–46.0)
Hemoglobin: 12.6 g/dL (ref 12.0–15.0)
MCH: 26.9 pg (ref 26.0–34.0)
MCHC: 32.1 g/dL (ref 30.0–36.0)
MCV: 83.6 fL (ref 80.0–100.0)
Platelets: 297 10*3/uL (ref 150–400)
RBC: 4.69 MIL/uL (ref 3.87–5.11)
RDW: 14.1 % (ref 11.5–15.5)
WBC: 6.1 10*3/uL (ref 4.0–10.5)
nRBC: 0 % (ref 0.0–0.2)

## 2023-03-26 LAB — TROPONIN I (HIGH SENSITIVITY): Troponin I (High Sensitivity): <2 ng/L (ref <18)

## 2023-03-26 MED ORDER — SODIUM CHLORIDE (PF) 0.9 % IJ SOLN
INTRAMUSCULAR | Status: AC
  Filled 2023-03-26: qty 50

## 2023-03-26 MED ORDER — IOHEXOL 350 MG/ML SOLN
75.0000 mL | Freq: Once | INTRAVENOUS | Status: AC | PRN
  Administered 2023-03-26: 75 mL via INTRAVENOUS

## 2023-03-26 MED ORDER — NITROFURANTOIN MONOHYD MACRO 100 MG PO CAPS: 100.0000 mg | ORAL_CAPSULE | Freq: Two times a day (BID) | ORAL | 0 refills | Status: AC

## 2023-03-26 NOTE — Discharge Instructions (Addendum)
Take the prescribed medication as directed.  Your STD testing will update into mychart, likely later today or tomorrow. Follow-up with your cardiologist. Return to the ED for new or worsening symptoms.

## 2023-03-26 NOTE — ED Notes (Signed)
Gave pt a cup of ice 

## 2023-03-26 NOTE — ED Triage Notes (Signed)
Pt reports hx of cardiac issues- bradycardia. She will have episodes of this and sees a cardiologist in [place]. She states it has been going on for 3 days. Her agreement with cards is 2 days max then visit ED for further work up.

## 2023-03-26 NOTE — ED Provider Notes (Signed)
[place] EMERGENCY DEPARTMENT AT [facility] Provider Note   CSN: [id number] Arrival date & time: 03/26/23  0209     History  Chief Complaint  Patient presents with   Chest Pain    Pamela Hart is a 27 y.o. female.  The history is provided by the patient and medical records.  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  States pain began Saturday evening, 5 days ago.  She states pain is fairly constant and almost feeling like cramping but has intermittent, sharp stabbing pains as well when taking deep breaths.  She denies feeling short of breath.  She denies any fever, chills, or cough.  She does follow with cardiology at [facility] for her bradycardia.  Hx of PE a few years ago, felt to be provoked by pregnancy.  Took xarelto for 6 months, no real issues since.  Also feels like she may have a UTI.  She reports some urinary frequency and urgency, little bit of pressure after she finishes urinating.  She denies any pelvic pain or vaginal discharge.  Home Medications Prior to Admission medications   Medication Sig Start Date End Date Taking? Authorizing Provider  cyclobenzaprine (FLEXERIL) 10 MG tablet Take 1 tablet (10 mg total) by mouth 2 (two) times daily as needed for muscle spasms. Patient not taking: Reported on 05/02/2022 01/23/20   Linwood Dibbles, MD  ibuprofen (ADVIL) 600 MG tablet Take 1 tablet (600 mg total) by mouth every 6 (six) hours as needed. Patient not taking: Reported on 03/26/2023 01/23/20   Raeford Razor, MD  methocarbamol (ROBAXIN) 500 MG tablet Take 1 tablet (500 mg total) by mouth 2 (two) times daily. Patient not taking: Reported on 05/02/2022 04/25/20   Mannie Stabile, PA-C  metroNIDAZOLE (FLAGYL) 500 MG tablet Take 1 tablet (500 mg total) by mouth 2 (two) times daily. Patient not taking: Reported on 03/26/2023 05/02/22   Tilden Fossa, MD   naproxen (NAPROSYN) 500 MG tablet Take 1 tablet (500 mg total) by mouth 2 (two) times daily. Patient not taking: Reported on 05/02/2022 04/25/20   Mannie Stabile, PA-C  predniSONE (DELTASONE) 20 MG tablet Take 2 tablets (40 mg total) by mouth daily. Patient not taking: Reported on 05/02/2022 01/23/20   Raeford Razor, MD      Allergies    Bee venom, Peanut oil, Penicillins, and Peanut-containing drug products    Review of Systems   Review of Systems  Cardiovascular:  Positive for chest pain.  Genitourinary:  Positive for frequency.  All other systems reviewed and are negative.   Physical Exam Updated Vital Signs BP (!) 96/58   Pulse 67   Temp 99.5 F (37.5 C)   Resp 17   Ht 5\' 11"  (1.803 m)   Wt 81.6 kg   LMP 03/08/2023 Comment: negativ beta HCG 03/26/23  SpO2 100%   BMI 25.10 kg/m   Physical Exam Vitals and nursing note reviewed.  Constitutional:      Appearance: She is well-developed.  HENT:     Head: Normocephalic and atraumatic.  Eyes:     Conjunctiva/sclera: Conjunctivae normal.     Pupils: Pupils are equal, round, and reactive to light.  Cardiovascular:     Rate and Rhythm: Normal rate and regular rhythm.     Heart sounds: Normal heart sounds.  Pulmonary:     Effort: Pulmonary effort is normal.     Breath  sounds: Normal breath sounds.  Chest:     Comments: No reproducible tenderness of the chest wall Abdominal:     General: Bowel sounds are normal.     Palpations: Abdomen is soft.  Musculoskeletal:        General: Normal range of motion.     Cervical back: Normal range of motion.  Skin:    General: Skin is warm and dry.  Neurological:     Mental Status: She is alert and oriented to person, place, and time.     ED Results / Procedures / Treatments   Labs (all labs ordered are listed, but only abnormal results are displayed) Labs Reviewed  BASIC METABOLIC PANEL - Abnormal; Notable for the following components:      Result Value   Calcium 8.5 (*)     All other components within normal limits  D-DIMER, QUANTITATIVE - Abnormal; Notable for the following components:   D-Dimer, Quant 0.63 (*)    All other components within normal limits  URINALYSIS, W/ REFLEX TO CULTURE (INFECTION SUSPECTED) - Abnormal; Notable for the following components:   APPearance HAZY (*)    Leukocytes,Ua TRACE (*)    Bacteria, UA MANY (*)    All other components within normal limits  CBC  I-STAT BETA HCG BLOOD, ED ([facility], [facility], [facility] ONLY)  GC/CHLAMYDIA PROBE AMP ([place]) NOT AT [facility]  TROPONIN I (HIGH SENSITIVITY)    EKG EKG Interpretation  Date/Time:  Wednesday March 26 2023 02:22:57 EDT Ventricular Rate:  61 PR Interval:  195 QRS Duration: 80 QT Interval:  388 QTC Calculation: 391 R Axis:   67 Text Interpretation: Sinus rhythm ST elev, probable normal early repol pattern No significant change was found Confirmed by Paula Libra ([id number]) on 03/26/2023 2:35:52 AM  Radiology CT Angio Chest PE W and/or Wo Contrast  Result Date: 03/26/2023 CLINICAL DATA:  Bradycardia. History of pulmonary embolus. Positive D-dimer. Clinical concern for pulmonary embolus. EXAM: CT ANGIOGRAPHY CHEST WITH CONTRAST TECHNIQUE: Multidetector CT imaging of the chest was performed using the standard protocol during bolus administration of intravenous contrast. Multiplanar CT image reconstructions and MIPs were obtained to evaluate the vascular anatomy. RADIATION DOSE REDUCTION: This exam was performed according to the departmental dose-optimization program which includes automated exposure control, adjustment of the mA and/or kV according to patient size and/or use of iterative reconstruction technique. CONTRAST:  75mL OMNIPAQUE IOHEXOL 350 MG/ML SOLN COMPARISON:  None Available. FINDINGS: Cardiovascular: The heart size is normal. No substantial pericardial effusion. There is no filling defect within the opacified pulmonary arteries to suggest the presence of an acute pulmonary embolus.  Mediastinum/Nodes: No mediastinal lymphadenopathy. There is no hilar lymphadenopathy. The esophagus has normal imaging features. There is no axillary lymphadenopathy. Lungs/Pleura: The lungs are clear without focal pneumonia, edema, pneumothorax or pleural effusion. Upper Abdomen: Visualized portion of the upper abdomen is unremarkable. Musculoskeletal: No worrisome lytic or sclerotic osseous abnormality. Review of the MIP images confirms the above findings. IMPRESSION: 1. No CT evidence for acute pulmonary embolus. 2. No acute findings in the chest. Electronically Signed   By: Kennith Center M.D.   On: 03/26/2023 05:34   DG Chest 2 View  Result Date: 03/26/2023 CLINICAL DATA:  Chest pain for several days EXAM: CHEST - 2 VIEW COMPARISON:  04/25/2020 FINDINGS: The heart size and mediastinal contours are within normal limits. Both lungs are clear. The visualized skeletal structures are unremarkable. IMPRESSION: No active cardiopulmonary disease. Electronically Signed   By: Eulah Pont.D.  On: 03/26/2023 03:05    Procedures Procedures    Medications Ordered in ED Medications  iohexol (OMNIPAQUE) 350 MG/ML injection 75 mL (75 mLs Intravenous Contrast Given 03/26/23 0507)    ED Course/ Medical Decision Making/ A&P                             Medical Decision Making Amount and/or Complexity of Data Reviewed Labs: ordered. Radiology: ordered and independent interpretation performed. ECG/medicine tests: ordered and independent interpretation performed.  Risk Prescription drug management.    71 old female presenting to the ED with chest pain.  Has been ongoing for few days now, constant pain but also some sharp, stabbing sensations in the left chest, worse with deep breathing.  Does have history of PE a few years ago, no longer on anticoagulation.  She is afebrile and nontoxic in appearance.  Her vitals are stable on room air.  EKG sinus rhythm without acute ischemic changes.  Labs are  reassuring, troponin negative.  Chest x-ray is clear.  Given her history, D-dimer was added and is positive.  Will obtain CTA.  She also reports some lower abdominal cramping, urinary frequency.  Will send UA.  CTA negative for acute PE.  UA is consistent with UTI.  Patient denies any pelvic pain or vaginal discharge but did request STD testing so gc/chl sent-- she will follow-up these results on my chart.  Will treat with course of macrobid (anaphylaxis to penicillin so avoiding cephalosporins, borderline prolonged QT so avoiding cipro as well).  Encouraged to follow-up with her cardiologist, did get appt scheduled 04/25/23.  Can return here for new concerns.  Final Clinical Impression(s) / ED Diagnoses Final diagnoses:  Chest pain in adult  Acute cystitis without hematuria    Rx / DC Orders ED Discharge Orders          Ordered    nitrofurantoin, macrocrystal-monohydrate, (MACROBID) 100 MG capsule  2 times daily        03/26/23 0602              Garlon Hatchet, PA-C 03/26/23 0605    Molpus, Jonny Ruiz, MD 03/26/23 [phone]

## 2023-03-27 LAB — GC/CHLAMYDIA PROBE AMP (~~LOC~~) NOT AT ARMC
Chlamydia: POSITIVE — AB
Comment: NEGATIVE
Comment: NORMAL
Neisseria Gonorrhea: NEGATIVE

## 2023-03-30 ENCOUNTER — Telehealth: Payer: Medicaid Other

## 2023-03-30 ENCOUNTER — Ambulatory Visit (HOSPITAL_COMMUNITY)
Admission: RE | Admit: 2023-03-30 | Discharge: 2023-03-30 | Disposition: A | Discharge status: Home / Self Care | Payer: Medicaid Other | Source: Ambulatory Visit | Attending: Internal Medicine | Admitting: Internal Medicine

## 2023-03-30 ENCOUNTER — Encounter (HOSPITAL_COMMUNITY): Payer: Self-pay

## 2023-03-30 VITALS — BP 100/68 | HR 69 | Temp 98.4°F | Resp 18

## 2023-03-30 DIAGNOSIS — A749 Chlamydial infection, unspecified: Secondary | ICD-10-CM

## 2023-03-30 MED ORDER — DOXYCYCLINE HYCLATE 100 MG PO CAPS: 100.0000 mg | ORAL_CAPSULE | Freq: Two times a day (BID) | ORAL | 0 refills | Status: AC

## 2023-03-30 NOTE — ED Provider Notes (Signed)
[facility]    CSN: [id number] Arrival date & time: 03/30/23  1615      History   Chief Complaint Chief Complaint  Patient presents with   Exposure to STD    Entered by patient   SEXUALLY TRANSMITTED DISEASE    HPI Pamela Hart is a 27 y.o. female presents to UC today with complaint of dysuria.  She reports this started a little over a week ago.  She was seen at [facility] long ED for the same.  Urinalysis was concerning for infection so they put her on Macrobid.  She saw her test results on MyChart and she tested positive for chlamydia however she did not receive treatment for this.  She would like treatment today.  She has not been sexually active in the last week.  HPI  Past Medical History:  Diagnosis Date   Anemia    Anxiety    Asthma    Bradycardia    Headache     Patient Active Problem List   Diagnosis Date Noted   Migraine with aura and without status migrainosus, not intractable 11/20/2020   Menstrual migraine without status migrainosus, not intractable 11/20/2020   IUD (intrauterine device) in place 11/20/2020    History reviewed. No pertinent surgical history.  OB History     Gravida  3   Para  2   Term  2   Preterm      AB  1   Living  2      SAB      IAB      Ectopic      Multiple      Live Births  2            Home Medications    Prior to Admission medications   Medication Sig Start Date End Date Taking? Authorizing Provider  doxycycline (VIBRAMYCIN) 100 MG capsule Take 1 capsule (100 mg total) by mouth 2 (two) times daily. 03/30/23  Yes Hinley Brimage, Salvadore Oxford, NP  nitrofurantoin, macrocrystal-monohydrate, (MACROBID) 100 MG capsule Take 1 capsule (100 mg total) by mouth 2 (two) times daily. 03/26/23  Yes Garlon Hatchet, PA-C  cyclobenzaprine (FLEXERIL) 10 MG tablet Take 1 tablet (10 mg total) by mouth 2 (two) times daily as needed for muscle spasms. Patient not taking: Reported on 05/02/2022 01/23/20   Linwood Dibbles, MD   ibuprofen (ADVIL) 600 MG tablet Take 1 tablet (600 mg total) by mouth every 6 (six) hours as needed. Patient not taking: Reported on 03/26/2023 01/23/20   Raeford Razor, MD  methocarbamol (ROBAXIN) 500 MG tablet Take 1 tablet (500 mg total) by mouth 2 (two) times daily. Patient not taking: Reported on 05/02/2022 04/25/20   Mannie Stabile, PA-C  metroNIDAZOLE (FLAGYL) 500 MG tablet Take 1 tablet (500 mg total) by mouth 2 (two) times daily. Patient not taking: Reported on 03/26/2023 05/02/22   Tilden Fossa, MD  naproxen (NAPROSYN) 500 MG tablet Take 1 tablet (500 mg total) by mouth 2 (two) times daily. Patient not taking: Reported on 05/02/2022 04/25/20   Mannie Stabile, PA-C  predniSONE (DELTASONE) 20 MG tablet Take 2 tablets (40 mg total) by mouth daily. Patient not taking: Reported on 05/02/2022 01/23/20   Raeford Razor, MD    Family History Family History  Problem Relation Age of Onset   Heart murmur Father    Hypertension Maternal Grandmother    Diabetes Maternal Grandfather     Social History Social History   Tobacco Use  Smoking status: Never   Smokeless tobacco: Never  Vaping Use   Vaping Use: Every day   Substances: Nicotine, Flavoring  Substance Use Topics   Alcohol use: Yes    Comment: sometimes   Drug use: Never     Allergies   Bee venom, Peanut oil, Penicillins, and Peanut-containing drug products   Review of Systems Review of Systems  Constitutional:  Negative for chills and fever.  Respiratory:  Negative for cough, chest tightness and shortness of breath.   Cardiovascular:  Negative for chest pain.  Gastrointestinal:  Negative for abdominal pain, nausea and vomiting.  Genitourinary:  Positive for dysuria. Negative for dyspareunia, frequency, hematuria, pelvic pain, urgency, vaginal discharge and vaginal pain.  Skin:  Negative for rash.  Neurological:  Negative for weakness and headaches.     Physical Exam Triage Vital Signs ED Triage Vitals   Enc Vitals Group     BP 03/30/23 1637 100/68     Pulse Rate 03/30/23 1637 69     Resp 03/30/23 1637 18     Temp 03/30/23 1637 98.4 F (36.9 C)     Temp Source 03/30/23 1637 Oral     SpO2 03/30/23 1637 99 %     Weight --      Height --      Head Circumference --      Peak Flow --      Pain Score 03/30/23 1635 0     Pain Loc --      Pain Edu? --      Excl. in GC? --    No data found.  Updated Vital Signs BP 100/68 (BP Location: Left Arm)   Pulse 69   Temp 98.4 F (36.9 C) (Oral)   Resp 18   LMP 03/08/2023 Comment: negativ beta HCG 03/26/23  SpO2 99%   Visual Acuity Right Eye Distance:   Left Eye Distance:   Bilateral Distance:    Right Eye Near:   Left Eye Near:    Bilateral Near:     Physical Exam Constitutional:      Appearance: Normal appearance.  HENT:     Head: Normocephalic.  Cardiovascular:     Rate and Rhythm: Normal rate and regular rhythm.     Heart sounds: Normal heart sounds.  Pulmonary:     Effort: Pulmonary effort is normal.     Breath sounds: Normal breath sounds.  Abdominal:     General: Abdomen is flat.     Palpations: Abdomen is soft.     Tenderness: There is no abdominal tenderness.  Skin:    General: Skin is warm and dry.     Findings: No lesion.  Neurological:     Mental Status: She is alert and oriented to person, place, and time.      UC Treatments / Results    Initial Impression / Assessment and Plan / UC Course  I have reviewed the triage vital signs and the nursing notes.  Pertinent labs & imaging results that were available during my care of the patient were reviewed by me and considered in my medical decision making (see chart for details).    27 year old female with positive chlamydia test at emergency department on 6/19.  No need to to re test today.  Will treat with doxycycline 100 mg twice daily x 7 days.  Avoid sexual activity for the next 7 days.  Discussed safe sexual practices.  Final Clinical Impressions(s) /  UC Diagnoses   Final diagnoses:  Chlamydia     Discharge Instructions      You were seen today for positive chlamydia test.  We are treating you with doxycycline 100 mg twice daily x 7 days.  Please avoid sexual intercourse for the next week.     ED Prescriptions     Medication Sig Dispense Auth. Provider   doxycycline (VIBRAMYCIN) 100 MG capsule Take 1 capsule (100 mg total) by mouth 2 (two) times daily. 14 capsule Lorre Munroe, NP      PDMP not reviewed this encounter.   Lorre Munroe, NP 03/30/23 1655

## 2023-03-30 NOTE — ED Triage Notes (Signed)
Pt states that she had a positive chlamydia but she hasn't been advised or treated. She did see her results on mychart.   Her partner has been treated and they havent had sex since.

## 2023-03-30 NOTE — Discharge Instructions (Signed)
You were seen today for positive chlamydia test.  We are treating you with doxycycline 100 mg twice daily x 7 days.  Please avoid sexual intercourse for the next week.

## 2023-04-23 ENCOUNTER — Ambulatory Visit: Payer: Medicaid Other

## 2023-05-12 ENCOUNTER — Ambulatory Visit: Payer: Medicaid Other | Admitting: Obstetrics and Gynecology

## 2023-05-14 ENCOUNTER — Telehealth: Payer: Self-pay | Admitting: *Deleted

## 2023-05-14 ENCOUNTER — Ambulatory Visit: Payer: Medicaid Other | Admitting: Nurse Practitioner

## 2023-05-14 NOTE — Telephone Encounter (Signed)
8.7.24 new pt appt no show /pt block for future appt/ no letter has been sent/pt has medicaid

## 2023-05-19 ENCOUNTER — Ambulatory Visit: Payer: Medicaid Other

## 2023-06-24 ENCOUNTER — Ambulatory Visit: Payer: Medicaid Other
